# Patient Record
Sex: Male | Born: 1950 | Race: White
Health system: Southern US, Community
[De-identification: ages and names within clinical notes are randomized; demographics above are authoritative.]

## PROBLEM LIST (undated history)

## (undated) DIAGNOSIS — K449 Diaphragmatic hernia without obstruction or gangrene: Secondary | ICD-10-CM

## (undated) DIAGNOSIS — I10 Essential (primary) hypertension: Secondary | ICD-10-CM

## (undated) DIAGNOSIS — K219 Gastro-esophageal reflux disease without esophagitis: Secondary | ICD-10-CM

## (undated) DIAGNOSIS — B009 Herpesviral infection, unspecified: Secondary | ICD-10-CM

## (undated) DIAGNOSIS — M1A9XX0 Chronic gout, unspecified, without tophus (tophi): Secondary | ICD-10-CM

## (undated) DIAGNOSIS — T8859XA Other complications of anesthesia, initial encounter: Secondary | ICD-10-CM

## (undated) DIAGNOSIS — C8590 Non-Hodgkin lymphoma, unspecified, unspecified site: Secondary | ICD-10-CM

## (undated) DIAGNOSIS — E785 Hyperlipidemia, unspecified: Secondary | ICD-10-CM

## (undated) DIAGNOSIS — M545 Low back pain, unspecified: Secondary | ICD-10-CM

## (undated) DIAGNOSIS — M1990 Unspecified osteoarthritis, unspecified site: Secondary | ICD-10-CM

## (undated) DIAGNOSIS — R519 Headache, unspecified: Secondary | ICD-10-CM

## (undated) DIAGNOSIS — N429 Disorder of prostate, unspecified: Secondary | ICD-10-CM

## (undated) DIAGNOSIS — K5730 Diverticulosis of large intestine without perforation or abscess without bleeding: Secondary | ICD-10-CM

## (undated) HISTORY — DX: Non-Hodgkin lymphoma, unspecified, unspecified site: C85.90

## (undated) HISTORY — DX: Diaphragmatic hernia without obstruction or gangrene: K44.9

## (undated) HISTORY — DX: Gastro-esophageal reflux disease without esophagitis: K21.9

## (undated) HISTORY — DX: Headache, unspecified: R51.9

## (undated) HISTORY — DX: Essential (primary) hypertension: I10

## (undated) HISTORY — DX: Hyperlipidemia, unspecified: E78.5

## (undated) HISTORY — DX: Other complications of anesthesia, initial encounter: T88.59XA

## (undated) HISTORY — PX: ABLATION OF DYSRHYTHMIC FOCUS: SHX254

---
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
## 1993-07-06 DIAGNOSIS — G35D Multiple sclerosis, unspecified: Secondary | ICD-10-CM

## 1993-07-06 DIAGNOSIS — G35 Multiple sclerosis: Secondary | ICD-10-CM

## 1993-07-06 HISTORY — DX: Multiple sclerosis: G35

## 1993-07-06 HISTORY — DX: Multiple sclerosis, unspecified: G35.D

## 1995-01-10 ENCOUNTER — Emergency Department: Admit: 1995-01-10 | Payer: Self-pay | Source: Emergency Department | Admitting: Emergency Medicine

## 1995-04-29 ENCOUNTER — Ambulatory Visit: Admit: 1995-04-29 | Disposition: A | Discharge status: Home / Self Care | Payer: Self-pay | Source: Ambulatory Visit

## 1997-04-11 ENCOUNTER — Ambulatory Visit: Admission: RE | Admit: 1997-04-11 | Payer: Self-pay | Source: Ambulatory Visit

## 1998-07-17 ENCOUNTER — Ambulatory Visit
Admit: 1998-07-17 | Disposition: A | Discharge status: Home / Self Care | Payer: Self-pay | Source: Ambulatory Visit | Admitting: Neurology

## 1999-02-04 DIAGNOSIS — K589 Irritable bowel syndrome without diarrhea: Secondary | ICD-10-CM

## 1999-02-04 HISTORY — DX: Irritable bowel syndrome, unspecified: K58.9

## 2001-05-12 ENCOUNTER — Emergency Department: Admit: 2001-05-12 | Payer: Self-pay | Source: Emergency Department | Admitting: Emergency Medicine

## 2001-12-09 ENCOUNTER — Emergency Department: Admit: 2001-12-09 | Payer: Self-pay | Source: Emergency Department | Admitting: Pediatric Emergency Medicine

## 2001-12-10 ENCOUNTER — Emergency Department: Admit: 2001-12-10 | Payer: Self-pay | Source: Emergency Department | Admitting: Pediatric Emergency Medicine

## 2002-11-14 ENCOUNTER — Ambulatory Visit: Admission: RE | Admit: 2002-11-14 | Payer: Self-pay | Source: Ambulatory Visit

## 2007-07-08 ENCOUNTER — Emergency Department: Admit: 2007-07-08 | Payer: Self-pay | Source: Emergency Department | Admitting: Emergency Medicine

## 2007-08-26 ENCOUNTER — Ambulatory Visit: Admission: RE | Admit: 2007-08-26 | Payer: Self-pay | Source: Ambulatory Visit

## 2008-06-25 ENCOUNTER — Ambulatory Visit: Admit: 2008-06-25 | Disposition: A | Discharge status: Home / Self Care | Payer: Self-pay | Source: Ambulatory Visit

## 2008-12-12 ENCOUNTER — Ambulatory Visit: Admission: RE | Admit: 2008-12-12 | Payer: Self-pay | Source: Ambulatory Visit

## 2010-07-06 HISTORY — PX: EYE SURGERY: SHX253

## 2010-07-22 ENCOUNTER — Emergency Department: Admit: 2010-07-22 | Payer: Self-pay | Source: Emergency Department | Admitting: Emergency Medicine

## 2010-12-05 DIAGNOSIS — H269 Unspecified cataract: Secondary | ICD-10-CM

## 2010-12-05 HISTORY — DX: Unspecified cataract: H26.9

## 2011-01-23 ENCOUNTER — Ambulatory Visit: Admit: 2011-01-23 | Discharge: 2011-01-23 | Payer: Self-pay | Source: Ambulatory Visit | Admitting: Emergency Medicine

## 2011-02-28 ENCOUNTER — Emergency Department
Admit: 2011-02-28 | Disposition: A | Discharge status: Home / Self Care | Payer: Self-pay | Source: Emergency Department | Admitting: Emergency Medicine

## 2011-04-24 NOTE — Consults (Signed)
Please disregard this entry. This report belongs to another patient.            [name]        [id number]       12/12/08)

## 2011-04-24 NOTE — Op Note (Unsigned)
DATE OF BIRTH:                        1951/01/05      ADMISSION DATE:                     08/26/2007            PATIENT LOCATION:                     END [facility] 19            DATE OF PROCEDURE:                   08/26/2007      SURGEON:                            Paschal Dopp, MD      ASSISTANT(S):                  PREOPERATIVE DIAGNOSIS:            POSTOPERATIVE DIAGNOSIS:            PROCEDURE:  COLONOSCOPY AND POLYPECTOMY.            INDICATIONS FOR PROCEDURE:  Rectal bleeding.            SEDATION:  Propofol per anesthesia.            INSTRUMENT:  Olympus video colonoscope.            DESCRIPTION OF PROCEDURE:  After appropriate sedation was obtained, the      colonoscope was passed through the anus and into the rectum.  The      colonoscope was slowly advanced with visualization of the lumen at 75 cm on      the way in.  There was a 5-mm sessile polyp removed with 3 bites of the      jumbo biopsy forceps.  Colonoscope was then advanced to the cecum.  The      cecal mucosa appeared normal.  The colonoscope was then withdrawn with      circumferential vision of the colonic mucosa.  The ascending colon, hepatic      flexure, transverse colon, splenic flexure, and descending colon are      normal.  In the sigmoid colon there is 1 solitary diverticulum.  On the way      out the polypectomy site is now seen at 50 cm.  The rectum appears normal.      The colonoscope is retroflexed, revealing internal hemorrhoids inside the      anal verge.  The colonoscope was then withdrawn.  A digital rectal exam was      unremarkable.  The patient tolerated the procedure well.            ENDOSCOPIC IMPRESSION:      1.   Two polyps removed.      2.   Diverticulum.      3.   Internal hemorrhoids.            PLAN:  The pathology will be checked in a few days.  Followup will be based      on the pathology.  ___________________________________          Date Signed: __________      Paschal Dopp, MD   [phone])            D: 08/26/2007 by Paschal Dopp, MD      T: 08/26/2007 by [id number] ([id number]) ([id number])      cc:  Paschal Dopp, MD

## 2011-04-24 NOTE — Op Note (Unsigned)
Account Number: [id number]      Document ID: [id number]      Admit Date: 12/12/2008      Procedure Date: 12/12/2008            Patient Location: [facility]      Patient Type: A            SURGEON: Paschal Dopp MD      ASSISTANT:                  PREOPERATIVE DIAGNOSIS:            POSTOPERATIVE DIAGNOSIS:            TITLE OF PROCEDURE:      Esophagogastroduodenoscopy.            INDICATIONS:      Gastroesophageal reflux disease.            SEDATION:      Propofol per anesthesia.            INSTRUMENT:      Olympus video colonoscope.            DESCRIPTION OF PROCEDURE:      After appropriate sedation was obtained, the endoscope was passed through      the hypopharynx and into the upper esophagus.  The endoscope was slowly      advanced through the esophagus and through the stomach and into the      duodenum.  The descending duodenum and duodenal bulb appeared normal.  The      endoscope was then withdrawn into the stomach and retroflexed.  The gastric      cardia appears normal from below.  The angularis appears normal.  The      endoscope was then straightened and withdrawn.  The gastric antrum and      gastric fundus look normal.  There was a moderate-sized hiatal hernia.  The      esophagus looks normal.  The endoscope was withdrawn.  The patient      tolerated the procedure well.            ENDOSCOPIC IMPRESSION:      1.  Hiatal hernia.      2.  No evidence of esophagitis.            PLAN:      The patient will continue on his proton pump inhibitor.                              _______________________________     Date/Time Signed: _____________      Paschal Dopp MD [phone])            D:  12/12/2008 10:33 AM by Dr. Paschal Dopp, MD [phone])      T:  12/12/2008 13:45 PM by [id number]          Everlean Cherry: [id number]) (Doc ID: [id number])                  IH:[id number] Myrtis Ser MD

## 2011-05-18 ENCOUNTER — Ambulatory Visit: Payer: BLUE CROSS/BLUE SHIELD

## 2011-05-20 ENCOUNTER — Ambulatory Visit
Admission: RE | Admit: 2011-05-20 | Discharge: 2011-05-20 | Disposition: A | Discharge status: Home / Self Care | Payer: BLUE CROSS/BLUE SHIELD | Source: Ambulatory Visit | Attending: Hospitalist | Admitting: Hospitalist

## 2011-05-20 ENCOUNTER — Ambulatory Visit: Payer: Self-pay

## 2011-05-20 ENCOUNTER — Ambulatory Visit: Payer: BLUE CROSS/BLUE SHIELD

## 2011-05-20 ENCOUNTER — Encounter: Admission: RE | Disposition: A | Discharge status: Home / Self Care | Payer: Self-pay | Source: Ambulatory Visit

## 2011-05-20 ENCOUNTER — Emergency Department: Payer: BLUE CROSS/BLUE SHIELD

## 2011-05-20 ENCOUNTER — Observation Stay
Admission: EM | Admit: 2011-05-20 | Discharge: 2011-05-21 | Disposition: A | Discharge status: Home / Self Care | Payer: BLUE CROSS/BLUE SHIELD | Attending: Emergency Medicine | Admitting: Emergency Medicine

## 2011-05-20 ENCOUNTER — Ambulatory Visit: Admission: RE | Admit: 2011-05-20 | Payer: Self-pay | Source: Ambulatory Visit

## 2011-05-20 ENCOUNTER — Observation Stay: Payer: BLUE CROSS/BLUE SHIELD | Admitting: Specialist

## 2011-05-20 DIAGNOSIS — M109 Gout, unspecified: Secondary | ICD-10-CM | POA: Insufficient documentation

## 2011-05-20 DIAGNOSIS — I10 Essential (primary) hypertension: Secondary | ICD-10-CM | POA: Insufficient documentation

## 2011-05-20 DIAGNOSIS — K449 Diaphragmatic hernia without obstruction or gangrene: Secondary | ICD-10-CM | POA: Insufficient documentation

## 2011-05-20 DIAGNOSIS — E785 Hyperlipidemia, unspecified: Secondary | ICD-10-CM | POA: Insufficient documentation

## 2011-05-20 DIAGNOSIS — Z860100 Personal history of colon polyps, unspecified: Secondary | ICD-10-CM | POA: Insufficient documentation

## 2011-05-20 DIAGNOSIS — G35 Multiple sclerosis: Secondary | ICD-10-CM | POA: Insufficient documentation

## 2011-05-20 DIAGNOSIS — Z9889 Other specified postprocedural states: Secondary | ICD-10-CM | POA: Insufficient documentation

## 2011-05-20 DIAGNOSIS — K219 Gastro-esophageal reflux disease without esophagitis: Secondary | ICD-10-CM | POA: Insufficient documentation

## 2011-05-20 DIAGNOSIS — K5730 Diverticulosis of large intestine without perforation or abscess without bleeding: Secondary | ICD-10-CM | POA: Insufficient documentation

## 2011-05-20 DIAGNOSIS — D126 Benign neoplasm of colon, unspecified: Secondary | ICD-10-CM | POA: Insufficient documentation

## 2011-05-20 DIAGNOSIS — R509 Fever, unspecified: Principal | ICD-10-CM | POA: Insufficient documentation

## 2011-05-20 DIAGNOSIS — Z7982 Long term (current) use of aspirin: Secondary | ICD-10-CM | POA: Insufficient documentation

## 2011-05-20 HISTORY — DX: Diverticulosis of large intestine without perforation or abscess without bleeding: K57.30

## 2011-05-20 HISTORY — DX: Chronic gout, unspecified, without tophus (tophi): M1A.9XX0

## 2011-05-20 LAB — COMPREHENSIVE METABOLIC PANEL
ALT: 36 U/L (ref 3–36)
AST (SGOT): 30 U/L (ref 10–41)
Albumin/Globulin Ratio: 1.4 (ref 1.1–1.8)
Albumin: 4.3 g/dL (ref 3.4–4.9)
Alkaline Phosphatase: 63 U/L (ref 43–112)
BUN: 16 mg/dL (ref 8–20)
Bilirubin, Total: 0.4 mg/dL (ref 0.1–1.0)
CO2: 22 mEq/L (ref 21–30)
Calcium: 8.6 mg/dL (ref 8.6–10.2)
Chloride: 100 mEq/L (ref 98–107)
Creatinine: 0.9 mg/dL (ref 0.6–1.5)
Globulin: 3.1 g/dL (ref 2.0–3.7)
Glucose: 116 mg/dL — ABNORMAL HIGH (ref 70–100)
Potassium: 3.6 mEq/L (ref 3.6–5.0)
Protein, Total: 7.4 g/dL (ref 6.0–8.0)
Sodium: 138 mEq/L (ref 136–146)

## 2011-05-20 LAB — URINALYSIS, REFLEX TO MICROSCOPIC EXAM IF INDICATED
Bilirubin, UA: NEGATIVE
Blood, UA: NEGATIVE
Glucose, UA: NEGATIVE
Ketones UA: NEGATIVE
Leukocyte Esterase, UA: NEGATIVE
Nitrite, UA: NEGATIVE
Specific Gravity UA POCT: 1.016 (ref 1.001–1.035)
Urine pH: 6 (ref 5.0–8.0)
Urobilinogen, UA: NORMAL mg/dL

## 2011-05-20 LAB — CBC AND DIFFERENTIAL
Basophils Absolute Automated: 0.01 10*3/uL (ref 0.00–0.20)
Basophils Automated: 0 % (ref 0–2)
Eosinophils Absolute Automated: 0.01 10*3/uL (ref 0.00–0.70)
Eosinophils Automated: 0 % (ref 0–5)
Hematocrit: 38.6 % — ABNORMAL LOW (ref 42.0–52.0)
Hgb: 13.2 g/dL (ref 13.0–17.0)
Immature Granulocytes Absolute: 0.02 10*3/uL
Immature Granulocytes: 0 % (ref 0–1)
Lymphocytes Absolute Automated: 0.66 10*3/uL (ref 0.50–4.40)
Lymphocytes Automated: 5 % — ABNORMAL LOW (ref 15–41)
MCH: 28 pg (ref 28.0–32.0)
MCHC: 34.2 g/dL (ref 32.0–36.0)
MCV: 81.8 fL (ref 80.0–100.0)
MPV: 9.5 fL (ref 9.4–12.3)
Monocytes Absolute Automated: 0.46 10*3/uL (ref 0.00–1.20)
Monocytes: 4 % (ref 0–11)
Neutrophils Absolute: 11.73 10*3/uL — ABNORMAL HIGH (ref 1.80–8.10)
Neutrophils: 91 % — ABNORMAL HIGH (ref 52–75)
Nucleated RBC: 0 /100 WBC
Platelets: 204 10*3/uL (ref 140–400)
RBC: 4.72 10*6/uL (ref 4.70–6.00)
RDW: 13 % (ref 12–15)
WBC: 12.89 10*3/uL — ABNORMAL HIGH (ref 3.50–10.80)

## 2011-05-20 LAB — I-STAT CG4 VENOUS CARTRIDGE
Lactic Acid I-Stat: 1.8 mEq/L (ref 0.5–2.2)
i-STAT Base Excess Venous: -1 mEq/L
i-STAT FIO2: 21
i-STAT HCO3 Bicarbonate Venous: 22.2 mEq/L
i-STAT O2 Saturation Venous: 70 %
i-STAT Patient Temperature: 100.9
i-STAT Total CO2 Venous: 23 mEq/L
i-STAT pCO2 Venous: 35 mmHg
i-STAT pH Venous: 7.415
i-STAT pO2 Venous: 38 mmHg

## 2011-05-20 LAB — GFR: EGFR: >60

## 2011-05-20 SURGERY — DONT USE, USE 1094-COLONOSCOPY, DIAGNOSTIC (SCREENING)
Anesthesia: Anesthesia General | Site: Anus

## 2011-05-20 MED ORDER — PROPOFOL 10 MG/ML IV EMUL
INTRAVENOUS | Status: DC | PRN
Start: 2011-05-20 — End: 2011-05-20
  Administered 2011-05-20: 200 ug/kg/min via INTRAVENOUS

## 2011-05-20 MED ORDER — LEVOFLOXACIN IN D5W 500 MG/100ML IV SOLN
500.00 mg | Freq: Every day | INTRAVENOUS | Status: DC
Start: 2011-05-21 — End: 2011-05-21

## 2011-05-20 MED ORDER — GLYCOPYRROLATE 0.2 MG/ML IJ SOLN
INTRAMUSCULAR | Status: DC | PRN
Start: 2011-05-20 — End: 2011-05-20
  Administered 2011-05-20: 0.2 mg via INTRAVENOUS

## 2011-05-20 MED ORDER — HEPARIN SODIUM (PORCINE) 5000 UNIT/ML IJ SOLN
5000.00 [IU] | Freq: Two times a day (BID) | INTRAMUSCULAR | Status: DC
Start: 2011-05-20 — End: 2011-05-21
  Administered 2011-05-21 (×2): 5000 [IU] via SUBCUTANEOUS
  Filled 2011-05-20 (×2): qty 1

## 2011-05-20 MED ORDER — PHENYLEPHRINE 100 MCG/ML IV BOLUS (ANESTHESIA)
PREFILLED_SYRINGE | INTRAVENOUS | Status: DC | PRN
Start: 2011-05-20 — End: 2011-05-20
  Administered 2011-05-20 (×3): 100 ug via INTRAVENOUS

## 2011-05-20 MED ORDER — SODIUM CHLORIDE 0.9 % IV BOLUS
1000.00 mL | Freq: Once | INTRAVENOUS | Status: AC
Start: 2011-05-20 — End: 2011-05-20
  Administered 2011-05-20: 1000 mL via INTRAVENOUS

## 2011-05-20 MED ORDER — METRONIDAZOLE IN NACL 500 MG/100 ML IV SOLN
500.00 mg | Freq: Once | INTRAVENOUS | Status: AC
Start: 2011-05-20 — End: 2011-05-20
  Administered 2011-05-20: 500 mg via INTRAVENOUS
  Filled 2011-05-20: qty 100

## 2011-05-20 MED ORDER — ACETAMINOPHEN 500 MG PO TABS
ORAL_TABLET | ORAL | Status: DC
Start: 2011-05-20 — End: 2011-05-20
  Filled 2011-05-20: qty 2

## 2011-05-20 MED ORDER — LACTATED RINGERS IV SOLN
INTRAVENOUS | Status: DC
Start: 2011-05-20 — End: 2011-05-20

## 2011-05-20 MED ORDER — LEVOFLOXACIN IN D5W 750 MG/150ML IV SOLN
750.00 mg | Freq: Once | INTRAVENOUS | Status: AC
Start: 2011-05-20 — End: 2011-05-20
  Administered 2011-05-20: 750 mg via INTRAVENOUS
  Filled 2011-05-20: qty 150

## 2011-05-20 MED ORDER — DOXAZOSIN MESYLATE 2 MG PO TABS
2.00 mg | ORAL_TABLET | Freq: Every evening | ORAL | Status: DC
Start: 2011-05-20 — End: 2011-05-21
  Administered 2011-05-21: 2 mg via ORAL
  Filled 2011-05-20 (×5): qty 1

## 2011-05-20 MED ORDER — LISINOPRIL 10 MG PO TABS
10.00 mg | ORAL_TABLET | Freq: Every day | ORAL | Status: DC
Start: 2011-05-21 — End: 2011-05-21
  Administered 2011-05-21: 10 mg via ORAL
  Filled 2011-05-20: qty 1

## 2011-05-20 MED ORDER — PANTOPRAZOLE SODIUM 40 MG PO TBEC
40.00 mg | DELAYED_RELEASE_TABLET | Freq: Every day | ORAL | Status: DC
Start: 2011-05-21 — End: 2011-05-21
  Filled 2011-05-20: qty 1

## 2011-05-20 MED ORDER — SODIUM CHLORIDE 0.9 % IV SOLN
INTRAVENOUS | Status: DC | PRN
Start: 2011-05-20 — End: 2011-05-20

## 2011-05-20 MED ORDER — ACETAMINOPHEN 500 MG PO TABS
1000.00 mg | ORAL_TABLET | Freq: Once | ORAL | Status: AC
Start: 2011-05-20 — End: 2011-05-20
  Administered 2011-05-20: 1000 mg via ORAL

## 2011-05-20 MED ORDER — PROPOFOL 10 MG/ML IV EMUL
INTRAVENOUS | Status: DC | PRN
Start: 2011-05-20 — End: 2011-05-20
  Administered 2011-05-20: 100 mg via INTRAVENOUS
  Administered 2011-05-20: 50 mg via INTRAVENOUS

## 2011-05-20 MED ORDER — METRONIDAZOLE IN NACL 500 MG/100 ML IV SOLN
500.00 mg | Freq: Three times a day (TID) | INTRAVENOUS | Status: DC
Start: 2011-05-21 — End: 2011-05-21
  Administered 2011-05-21: 500 mg via INTRAVENOUS
  Filled 2011-05-20 (×2): qty 100

## 2011-05-20 MED ORDER — ACETAMINOPHEN 325 MG PO TABS
650.0000 mg | ORAL_TABLET | Freq: Four times a day (QID) | ORAL | Status: DC | PRN
Start: 2011-05-20 — End: 2011-05-21
  Administered 2011-05-21: 650 mg via ORAL
  Filled 2011-05-20: qty 2

## 2011-05-20 SURGICAL SUPPLY — 12 items
APPLICATOR PROCTOSCOPIC 16IN (Applicator) ×1 IMPLANT
CONTAINER SPEC LLDPE 16OZ W LF NS LEK (Procedure Accessories) ×2 IMPLANT
CONTAINER SPECIMEN C16 OZ WIDE LEAK SHATTER RESISTANT SNAP ON LID (Procedure Accessories) ×2 IMPLANT
FORCEP BIOPSY 240CM RADIAL JA (Instrument) ×1 IMPLANT
FORCEP BIOPSY 240CM RADIALJAW (Instrument) ×1 IMPLANT
GLOVE NITRILE PREMIERPRO MED (Glove) ×3 IMPLANT
JELLY KY LUBRICATNG 2 OZ FLIP (Procedure Accessories) ×1 IMPLANT
PAD ELECTROSRG GRND REM W CRD (Procedure Accessories) ×1 IMPLANT
SYRINGE 50 ML GRADUATE NONPYROGENIC DEHP (Syringes, Needles) ×1
SYRINGE 50 ML GRADUATE NONPYROGENIC DEHP FREE PVC FREE BD MEDICAL (Syringes, Needles) ×1 IMPLANT
TUBING SUCTION ID3/16 IN L10 FT (Tubing) ×1 IMPLANT
TUBING SUCTION ID3/16 IN L10 FT NONCONDUCTIVE STRAIGHT MALE FEMALE (Tubing) ×1 IMPLANT

## 2011-05-20 NOTE — ED Notes (Signed)
Lactate: 1.82

## 2011-05-20 NOTE — ED Provider Notes (Signed)
History     Chief Complaint   Patient presents with   . Fever     HPI Comments: 60 y.o. M, BIBA, h/o HTN, high chol, GERD, hiatal hernia, and multiple sclerosis p/w fevers TMAX 103.8 (in amb) onset this afternoon. Pt reports he had a routine colonoscopy this AM, got home and ate some toast then began to shake uncontrollably, took temp and measured 102.0. Pt reports he has had 4 colonoscopies before and has never had these sxs after before. Pt sts he feels improved now in ED. Denies n/v/d, abd pain, SOB or HA. No other complaints.     PMD + GI doc: Paschal Dopp    Patient is a 60 y.o. male presenting with fever. The history is provided by the patient and the spouse. No language interpreter was used.   Fever  Primary symptoms of the febrile illness include fever. Primary symptoms do not include headaches, shortness of breath, abdominal pain, nausea, vomiting or diarrhea. The current episode started today. This is a new problem. The problem has been gradually improving.   Associated with: unknown.       Past Medical History   Diagnosis Date   . Hypertension    . Hyperlipidemia    . GERD (gastroesophageal reflux disease)    . Hiatal hernia    . Multiple sclerosis        Past Surgical History   Procedure Date   . Tonsillectomy      at age 19   . Eye surgery      bil cataracts removed       No family history on file.    No current facility-administered medications for this encounter.     Current Outpatient Prescriptions   Medication Sig Dispense Refill   . acyclovir (ZOVIRAX) 200 MG capsule Take 200 mg by mouth daily.         Marland Kitchen aspirin 81 MG tablet Take 81 mg by mouth daily.         Marland Kitchen atropine-PHENobarbital-scopolamine-hyoscyamine (DONNATAL) 16.2 MG per tablet Take 1 tablet by mouth 2 (two) times daily.         Marland Kitchen doxazosin (CARDURA) 2 MG tablet Take 2 mg by mouth nightly.         . ergocalciferol (ERGOCALCIFEROL) 50000 UNITS capsule Take 5,000 Units by mouth daily.         . Febuxostat (ULORIC) 40 MG tablet Take 40 mg by  mouth daily.         Marland Kitchen gemfibrozil (LOPID) 600 MG tablet Take 600 mg by mouth 2 (two) times daily before meals.         Marland Kitchen glucosamine-chondroitin 500-400 MG tablet Take 3 tablets by mouth 2 (two) times daily.         . lansoprazole (PREVACID) 15 MG capsule Take 15 mg by mouth daily.         Marland Kitchen lisinopril (PRINIVIL,ZESTRIL) 10 MG tablet Take 10 mg by mouth daily.         . ranitidine (ZANTAC) 300 MG capsule Take 300 mg by mouth every evening.           Facility-Administered Medications Ordered in Other Encounters   Medication Dose Route Frequency Provider Last Rate Last Dose   . DISCONTD: 0.9%  NaCl infusion    Continuous PRN Glenna Fellows, MD       . DISCONTD: glycopyrrolate (ROBINUL) injection    PRN Glenna Fellows, MD   0.2 mg at 05/20/11 0951   .  DISCONTD: lactated ringers infusion   Intravenous Continuous Paschal Dopp, MD       . DISCONTD: phenylEPHrine (NEOSYNEPHRINE) 100 mcg/mL IV syringe    PRN Glenna Fellows, MD   100 mcg at 05/20/11 1000   . DISCONTD: propofol (DIPRIVAN) injection    PRN Glenna Fellows, MD   50 mg at 05/20/11 0945   . DISCONTD: propofol (DIPRIVAN) injection    Continuous PRN Glenna Fellows, MD   75 mcg/kg/min at 05/20/11 0950       Allergies   Allergen Reactions   . Erythromycin Fever       History   Substance Use Topics   . Smoking status: Never Smoker    . Smokeless tobacco: Not on file   . Alcohol Use: No       Review of Systems   Constitutional: Positive for fever and chills (chills improved).   Respiratory: Negative for shortness of breath.    Cardiovascular: Negative for chest pain.   Gastrointestinal: Negative for nausea, vomiting, abdominal pain and diarrhea.   Neurological: Negative for headaches.   All other systems reviewed and are negative.        Physical Exam   BP 149/73  Pulse 117  Temp(Src) 100.9 F (38.3 C) (Oral)  Resp 20  Ht 1.753 m  Wt 83.008 kg  BMI 27.01 kg/m2  SpO2 98%    Physical Exam   Constitutional: He is oriented to person, place, and time. He appears well-developed and  well-nourished.   Neck: Neck supple.   Cardiovascular: Regular rhythm and normal heart sounds.         tachy   Pulmonary/Chest: Effort normal and breath sounds normal. No respiratory distress.   Abdominal: Soft. Bowel sounds are normal. He exhibits no distension. There is no tenderness.        Soft, no abd ttp   Musculoskeletal: Normal range of motion. He exhibits no edema and no tenderness.   Neurological: He is alert and oriented to person, place, and time.   Skin: Skin is warm and dry.   Psychiatric: He has a normal mood and affect.       ED Course   Procedures    MDM      .I am scribing for Latanya Presser, MD on [facility] K. Ahronovich (ED Scribe)    3:55 PM  05/20/2011     6:56 PM d/w Dr. York Grice, on call for Dr. Georga Bora. Agrees to admit to med/tele, obs status     EKG interpretation: Charted by [facility] K. Ahronovich as interpreted by Latanya Presser, MD:  Interpretation: Normal sinus at 100. No acute ST/T wave changes.     Latanya Presser, MD  05/20/11 2122

## 2011-05-20 NOTE — Brief Op Note (Signed)
BRIEF OP NOTE    Date Time: 05/20/2011 10:14 AM    Patient Name:   Emori Kamau    Date of Operation:   05/20/2011    Providers Performing:   Surgeon(s):  Paschal Dopp, MD    Assistant (s):    Operative Procedure:   Procedure(s):  COLONOSCOPY    Preoperative Diagnosis:   Pre-Op Diagnosis Codes:     * Benign neoplasm of colon [211.3]    Postoperative Diagnosis:   * No post-op diagnosis entered *    Anesthesia:   IV Sedation    Estimated Blood Loss:        Implants:   * No implants in log *          Specimens:        SPECIMENS (last 24 hours)      Non-Carefusion Specimens     Row Name 05/20/11 1000                Specimen Information    Specimen Testing Required Routine Pathology     Specimen ID (letter) a     Specimen Description polyps @ 20cm     Specimen Information    Specimen Testing Required Routine Pathology     Specimen ID (letter) b     Specimen Description polyp @ 15cm           Findings:   2 polypss at 20 cm,  One polyp  At 15 cm sigmoid diverticulosis  Internal hemorrhoids    Complications:   None      Signed by: Paschal Dopp, MD                                                                              [facility] END[facility]

## 2011-05-20 NOTE — ED Notes (Signed)
Colonoscopy this am ,  4 hours after procedure pt started shaking and had a fever ,  Now lasting last 4 hours  Temp 103.8 tympanic 162/70 hr 124 rr28  lw 20g ST on monitor ems hung .9 nss kvo hung

## 2011-05-20 NOTE — H&P (Signed)
ADMISSION HISTORY AND PHYSICAL EXAM    Date Time: 05/20/2011 9:22 AM  Patient Name: [name] Young  Attending Physician: Paschal Dopp, MD    Assessment:   Patient has had previous polyps    Plan:   colonoscopy    History of Present Illness:   Kayne Yuhas is a 60 y.o. male who presents to the hospital with previous polyps    Past Medical History:     Past Medical History   Diagnosis Date   . Hypertension    . Hyperlipidemia    . GERD (gastroesophageal reflux disease)    . Hiatal hernia    . Multiple sclerosis        Past Surgical History:     Past Surgical History   Procedure Date   . Tonsillectomy      at age 67   . Eye surgery      bil cataracts removed       Family History:   History reviewed. No pertinent family history.    Social History:     History     Social History   . Marital Status: Married     Spouse Name: N/A     Number of Children: N/A   . Years of Education: N/A     Social History Main Topics   . Smoking status: Never Smoker    . Smokeless tobacco: Not on file   . Alcohol Use: No   . Drug Use: No   . Sexually Active:      Other Topics Concern   . Not on file     Social History Narrative   . No narrative on file       Allergies:     Allergies   Allergen Reactions   . Erythromycin Fever       Medications:     Prescriptions prior to admission   Medication Sig   . acyclovir (ZOVIRAX) 200 MG capsule Take 200 mg by mouth daily.     Marland Kitchen aspirin 81 MG tablet Take 81 mg by mouth daily.     Marland Kitchen atropine-PHENobarbital-scopolamine-hyoscyamine (DONNATAL) 16.2 MG per tablet Take 1 tablet by mouth 2 (two) times daily.     Marland Kitchen doxazosin (CARDURA) 2 MG tablet Take 2 mg by mouth nightly.     . ergocalciferol (ERGOCALCIFEROL) 50000 UNITS capsule Take 5,000 Units by mouth daily.     . Febuxostat (ULORIC) 40 MG tablet Take 40 mg by mouth daily.     Marland Kitchen gemfibrozil (LOPID) 600 MG tablet Take 600 mg by mouth 2 (two) times daily before meals.     Marland Kitchen glucosamine-chondroitin 500-400 MG tablet Take 3 tablets by mouth 2 (two) times  daily.     . lansoprazole (PREVACID) 15 MG capsule Take 15 mg by mouth daily.     Marland Kitchen lisinopril (PRINIVIL,ZESTRIL) 10 MG tablet Take 10 mg by mouth daily.     . ranitidine (ZANTAC) 300 MG capsule Take 300 mg by mouth every evening.         Review of Systems:   A comprehensive review of systems was:     Physical Exam:     Filed Vitals:    05/20/11 0848   BP: 169/81   Pulse: 80   Temp: 95.9 F (35.5 C)       Intake and Output Summary (Last 24 hours) at Date Time  No intake or output data in the 24 hours ending 05/20/11 0922    Chest - clear to auscultation,  no wheezes, rales or rhonchi, symmetric air entry    Heart RR  abd benign    Labs:     Results     ** No Results found for the last 24 hours. **          Recent CMP No results found for this basename: GLU,BUN,CREAT,NA,CK, CL,CO2,CA,TP,ALB,AST,ALT,ALP,BILIT,GLOB,AG, AGAP in the last 24 hours    Rads:   Radiological Procedure reviewed.    Signed by: Paschal Dopp

## 2011-05-20 NOTE — Transfer of Care (Signed)
Anesthesia Transfer of Care Note    Patient: [name] Young    Procedures performed: Procedure(s) with comments:  COLONOSCOPY - GELAB/OP/IVA    Anesthesia type: General TIVA    Patient location:Phase II PACU    Last vitals:   Filed Vitals:    05/20/11 0848   BP: 169/81   Pulse: 80   Temp: 95.9 F (35.5 C)       Post pain: Patient not complaining of pain, continue current therapy     Mental Status:awake    Respiratory Function: tolerating face mask    Cardiovascular: stable    Nausea/Vomiting: patient not complaining of nausea or vomiting    Hydration Status: adequate    Regional Anesthesia: no apparent complications    Post assessment: no apparent anesthetic complications and no reportable events      BP 97/64, HR 84 SR,  RR 25, O2 sats 99.    Report to RN.  VSS

## 2011-05-20 NOTE — Anesthesia Preprocedure Evaluation (Addendum)
Anesthesia Evaluation    AIRWAY    Mallampati: III    TM distance: >3 FB  Neck ROM: full  Mouth Opening:full   CARDIOVASCULAR    cardiovascular exam normal     DENTAL     PULMONARY    pulmonary exam normal     OTHER FINDINGS          Anesthesia Plan    ASA 3   general   Detailed anesthesia plan: general IV        intravenous induction   informed consent obtained    Plan discussed with CRNA.        <IHSANLANDD>

## 2011-05-21 LAB — CBC AND DIFFERENTIAL
Basophils Absolute Automated: 0.03 10*3/uL (ref 0.00–0.20)
Basophils Automated: 0 % (ref 0–2)
Eosinophils Absolute Automated: 0.07 10*3/uL (ref 0.00–0.70)
Eosinophils Automated: 0 % (ref 0–5)
Hematocrit: 35.8 % — ABNORMAL LOW (ref 42.0–52.0)
Hgb: 12.2 g/dL — ABNORMAL LOW (ref 13.0–17.0)
Immature Granulocytes Absolute: 0.05 10*3/uL
Immature Granulocytes: 0 % (ref 0–1)
Lymphocytes Absolute Automated: 2.77 10*3/uL (ref 0.50–4.40)
Lymphocytes Automated: 18 % (ref 15–41)
MCH: 27.6 pg — ABNORMAL LOW (ref 28.0–32.0)
MCHC: 34.1 g/dL (ref 32.0–36.0)
MCV: 81 fL (ref 80.0–100.0)
MPV: 9.3 fL — ABNORMAL LOW (ref 9.4–12.3)
Monocytes Absolute Automated: 0.91 10*3/uL (ref 0.00–1.20)
Monocytes: 6 % (ref 0–11)
Neutrophils Absolute: 11.78 10*3/uL — ABNORMAL HIGH (ref 1.80–8.10)
Neutrophils: 76 % — ABNORMAL HIGH (ref 52–75)
Nucleated RBC: 0 /100 WBC
Platelets: 209 10*3/uL (ref 140–400)
RBC: 4.42 10*6/uL — ABNORMAL LOW (ref 4.70–6.00)
RDW: 13 % (ref 12–15)
WBC: 15.61 10*3/uL — ABNORMAL HIGH (ref 3.50–10.80)

## 2011-05-21 LAB — COMPREHENSIVE METABOLIC PANEL
ALT: 30 U/L (ref 3–36)
AST (SGOT): 22 U/L (ref 10–41)
Albumin/Globulin Ratio: 1.1 (ref 1.1–1.8)
Albumin: 3.4 g/dL (ref 3.4–4.9)
Alkaline Phosphatase: 56 U/L (ref 43–112)
BUN: 15 mg/dL (ref 8–20)
Bilirubin, Total: 0.7 mg/dL (ref 0.1–1.0)
CO2: 22 mEq/L (ref 21–30)
Calcium: 8.7 mg/dL (ref 8.6–10.2)
Chloride: 104 mEq/L (ref 98–107)
Creatinine: 0.9 mg/dL (ref 0.6–1.5)
Globulin: 3 g/dL (ref 2.0–3.7)
Glucose: 97 mg/dL (ref 70–100)
Potassium: 3.9 mEq/L (ref 3.6–5.0)
Protein, Total: 6.4 g/dL (ref 6.0–8.0)
Sodium: 138 mEq/L (ref 136–146)

## 2011-05-21 LAB — ECG 12-LEAD
Atrial Rate: 101 {beats}/min
P Axis: 25 degrees
P-R Interval: 194 ms
Q-T Interval: 296 ms
QRS Duration: 80 ms
QTC Calculation (Bezet): 383 ms
R Axis: -1 degrees
T Axis: 4 degrees
Ventricular Rate: 101 {beats}/min

## 2011-05-21 LAB — GFR: EGFR: >60

## 2011-05-21 MED ORDER — LEVOFLOXACIN 500 MG PO TABS
750.00 mg | ORAL_TABLET | Freq: Every day | ORAL | Status: AC
Start: 2011-05-21 — End: 2011-05-31

## 2011-05-21 NOTE — H&P (Signed)
HISTORY OF PRESENT ILLNESS:  The patient is a 60 year old white male who is a patient of Dr. Paschal Dopp, who was admitted after he experienced rigors and chills post a  colonoscopy procedure overnight on observation.     PAST MEDICAL HISTORY:  Significant for benign prostatic hyperplasia, history of hyperlipidemia,  hypertension.     SOCIAL HISTORY:  Negative for any alcohol, tobacco, or IV drug abuse.     FAMILY HISTORY:  Noncontributory.     MEDICATIONS:  His outpatient list of medications are acyclovir, aspirin, Cardura,,  glucosamine, Prevacid, lisinopril, multivitamin, and Zantac.     REVIEW OF SYSTEMS:  Currently negative for any fever, chills, nausea, vomiting, diarrhea,  headache, lightheadedness, or dizziness.     ALLERGIES:  ERYTHROMYCIN and ALLOPURINOL.     PHYSICAL EXAMINATION:  VITAL SIGNS:  His T-max yesterday was 103.8, currently is 96.6, heart rate  of 76, respirations of 20, blood pressure 132/69, saturating 98% on room  air.  GENERAL:  At the time of my exam, he is alert and oriented x3, no apparent  respiratory distress.  HEENT:  Normocephalic, atraumatic.  His extraocular movements are intact.   Pupils equal, round, reactive to light and accommodation.  NECK:  No JVD, no lymphadenopathy, no thyromegaly.  HEART:  S1, S2 are audible.  CHEST:  Clear to auscultation bilaterally.  ABDOMEN:  Soft, nontender, normoactive bowel sounds, no hepatosplenomegaly.  EXTREMITIES:  Show no cyanosis, clubbing or edema.  SKIN:  Warm and dry.  MUSCULOSKELETAL:  No obvious deformity.  NEUROLOGIC:  Nonfocal.     LABORATORY AND DIAGNOSTIC DATA:  White count is 15.61, hematocrit of 35.8, platelets of 209.  Sodium is 138,  potassium 3.9, chloride 104, bicarbonate 22, BUN 15, creatinine 0.9, blood  sugar of 97.  His urine is pH of 6, specific gravity of 1.016 with a trace  protein, negative for nitrites.  Blood cultures obtained in the ER does not  show any acute infection.  Chest x-ray does not show any infection.      ASSESSMENT AND PLAN:  The patient admitted overnight with diagnosis of possible infection.  We  started him on Levaquin and Flagyl IV.  Plan is to observe him for 24  hours, and if clinically stable, the patient is going to be discharged and  follow with Dr. Paschal Dopp in his office with blood cultures pending as an  outpatient.

## 2011-05-21 NOTE — Progress Notes (Signed)
Pt. And Pt.'s wife read and given discharge instructions, and prescriptions, both stated understanding. Tech is removing IV's and telemetry, awaiting escort out.

## 2011-05-21 NOTE — Consults (Signed)
[name] Young  60 y.o. Male     Nutrition Consult re: diet  Current Medical condition: pt admit to floor for observaton    Past Medical History   Diagnosis Date   . Hypertension    . Hyperlipidemia    . GERD (gastroesophageal reflux disease)    . Hiatal hernia    . Multiple sclerosis    . Chronic gout    . Diverticulosis of colon without diverticulitis      Labs:  Reviewed CMP WNL  Meds: vitamin D, PPI     Anthropometrics  Height: 175.3 cm (5\' 9" )  Weight: 83.008 kg (183 lb)  Weight Change: 0   BMI (Calculated): 27.1     Diet: Regular     Nutrition Diagnosis: no acute nutrition issues at this time.       Intervention:  No further follow up; unless re consulted.       Cloyde Reams, MS, RD, CSR   Ext [phone] or Pager [phone]

## 2011-05-21 NOTE — Progress Notes (Signed)
Patient arrived to the floor for observation. vss no complaint of pain at this time

## 2011-05-21 NOTE — Progress Notes (Signed)
Talmage Teaster is a 60 y.o. male patient.  Active Problems:   * No active hospital problems. *     Past Medical History   Diagnosis Date   . Hypertension    . Hyperlipidemia    . GERD (gastroesophageal reflux disease)    . Hiatal hernia    . Multiple sclerosis    . Chronic gout    . Diverticulosis of colon without diverticulitis      Current Facility-Administered Medications   Medication Dose Route Frequency Provider Last Rate Last Dose   . acetaminophen (TYLENOL) tablet 1,000 mg  1,000 mg Oral Once Latanya Presser, MD   1,000 mg at 05/20/11 2028   . acetaminophen (TYLENOL) tablet 650 mg  650 mg Oral Q6H PRN Schuyler Amor, MD   650 mg at 05/21/11 0054   . doxazosin (CARDURA) tablet 2 mg  2 mg Oral QHS Schuyler Amor, MD   2 mg at 05/21/11 0234   . heparin (porcine) injection 5,000 Units  5,000 Units Subcutaneous BID Schuyler Amor, MD   5,000 Units at 05/21/11 [phone]   . levofloxacin (LEVAQUIN) IVPB 500 mg  500 mg Intravenous Daily Schuyler Amor, MD       . levofloxacin [facility]) IVPB 750 mg  750 mg Intravenous Once Latanya Presser, MD   750 mg at 05/20/11 2120   . lisinopril (PRINIVIL,ZESTRIL) tablet 10 mg  10 mg Oral Daily Schuyler Amor, MD   10 mg at 05/21/11 0959   . metroNIDAZOLE (FLAGYL) IVPB 500 mg  500 mg Intravenous Once Latanya Presser, MD   500 mg at 05/20/11 2018   . metroNIDAZOLE (FLAGYL) IVPB 500 mg  500 mg Intravenous [facility] Schuyler Amor, MD   500 mg at 05/21/11 0430   . pantoprazole (PROTONIX) EC tablet 40 mg  40 mg Oral Daily Schuyler Amor, MD       . sodium chloride 0.9 % bolus 1,000 mL  1,000 mL Intravenous Once Latanya Presser, MD   1,000 mL at 05/20/11 1638   . sodium chloride 0.9 % bolus 1,000 mL  1,000 mL Intravenous Once Latanya Presser, MD   1,000 mL at 05/20/11 1821   . DISCONTD: acetaminophen (TYLENOL) 500 MG tablet              Allergies   Allergen Reactions   . Erythromycin Fever   . Allopurinol Hives     Blood pressure 132/69, pulse 76, temperature 96.6 F (35.9 C), temperature source Oral,  resp. rate 20, height 1.753 m (5\' 9" ), weight 83.008 kg (183 lb), SpO2 98.00%.    Subjective:  Symptoms:  Resolved.  No shortness of breath, malaise, cough, chest pain, weakness, headache, chest pressure, anorexia, diarrhea or anxiety.    Diet:  Adequate intake.  No nausea or vomiting.    Activity level: Normal.    Pain:  He reports no pain.      Objective:  General Appearance:  Comfortable.    Vital signs: (most recent): Blood pressure 132/69, pulse 76, temperature 96.6 F (35.9 C), temperature source Oral, resp. rate 20, height 1.753 m (5\' 9" ), weight 83.008 kg (183 lb), SpO2 98.00%.  Vital signs are normal.    Output: Producing urine and producing stool.    HEENT: Normal HEENT exam.    Lungs:  Normal respiratory rate and normal effort.  He is not in respiratory distress.  Breath sounds clear to auscultation.  No stridor.  No  wheezes, rales, rhonchi or decreased breath sounds.    Heart: Normal rate.  Regular rhythm.  S1 normal and S2 normal.    Chest: No chest wall tenderness.  Symmetric chest wall expansion.   Extremities: Normal range of motion.    Neurological: Patient is oriented to person, place and time.    Skin:  Warm and dry.    Abdomen: Abdomen is soft.    Bowel sounds:  Bowel sounds are normal.  There is no abdominal tenderness.   There is no mass. There is no splenomegaly. There is no hepatomegaly.   Pupils:  Pupils are equal, round, and reactive to light.    Pulses: Distal pulses are intact.      Results     Procedure Component Value Units Date/Time    Comprehensive metabolic panel [[id number]] Collected:05/21/11 0511    Specimen Information:Blood Updated:05/21/11 0712     Glucose 97 mg/dL      BUN 15 mg/dL      Creatinine 0.9 mg/dL      Sodium 884 mEq/L      Potassium 3.9 mEq/L      Chloride 104 mEq/L      CO2 22 mEq/L      Calcium 8.7 mg/dL      Total Protein 6.4 g/dL      Albumin 3.4 g/dL      AST (SGOT) 22 U/L      ALT 30 U/L      Alkaline Phosphatase 56 U/L      Bilirubin, Total 0.7 mg/dL       Globulin 3.0 g/dL      Albumin/Globulin Ratio 1.1     GFR [[id number]] Collected:05/21/11 0511     GFR Caucasian >60   Updated:05/21/11 0712    CBC and differential [[id number]]  (Abnormal) Collected:05/21/11 0511    Specimen Information:Blood Updated:05/21/11 0605     White Blood Cells 15.61 (H) x10 3/uL      RBC 4.42 (L) x10 6/uL      Hgb 12.2 (L) g/dL      Hematocrit 73.2 (L) %      MCV 81.0 fL      MCH 27.6 (L) pg      MCHC 34.1 g/dL      RDW 13 %      Platelets 209 x10 3/uL      MPV 9.3 (L) fL      Neutrophils 76 (H) %      Lymphocytes Automated 18 %      Monocytes Automated 6 %      Eosinophils Automated 0 %      Basophils Automated 0 %      Immature Granulocyte 0 %      Nucleated RBC . 0 /100 WBC      Neutrophils Absolute 11.78 (H) x10 3/uL      Abs Lymph Automated 2.77 x10 3/uL      Abs Mono Automated 0.91 x10 3/uL      Abs Eos Automated 0.07 x10 3/uL      Absolute Baso Automated 0.03 x10 3/uL      Absolute Immature Granulocyte 0.05 x10 3/uL     Urine Culture [[id number]] Collected:05/20/11 1632    Specimen Information:Urine / Urine, Clean Catch Updated:05/20/11 1806    Blood Culture #1 [[id number]] Collected:05/20/11 1628    Specimen Information:Blood / Blood Updated:05/20/11 1756    Narrative:    Site?->arm    Blood Culture #2 [[id number]] Collected:05/20/11 1628    Specimen Information:Blood /  Blood Updated:05/20/11 1756    Narrative:    Site?->arm    Comprehensive Metabolic Panel (CMP) [[id number]]  (Abnormal) Collected:05/20/11 1627    Specimen Information:Blood Updated:05/20/11 1707     Glucose 116 (H) mg/dL      BUN 16 mg/dL      Creatinine 0.9 mg/dL      Sodium 413 mEq/L      Potassium 3.6 mEq/L      Chloride 100 mEq/L      CO2 22 mEq/L      Calcium 8.6 mg/dL      Total Protein 7.4 g/dL      Albumin 4.3 g/dL      AST (SGOT) 30 U/L      ALT 36 U/L      Alkaline Phosphatase 63 U/L      Bilirubin, Total 0.4 mg/dL      Globulin 3.1 g/dL      Albumin/Globulin Ratio 1.4     GFR [[id number]] Collected:05/20/11 1627      GFR Caucasian >60   Updated:05/20/11 1707    CBC and Differential [[id number]]  (Abnormal) Collected:05/20/11 1627    Specimen Information:Blood Updated:05/20/11 1657     White Blood Cells 12.89 (H) x10 3/uL      RBC 4.72 x10 6/uL      Hgb 13.2 g/dL      Hematocrit 34.7 (L) %      MCV 81.8 fL      MCH 28.0 pg      MCHC 34.2 g/dL      RDW 13 %      Platelets 204 x10 3/uL      MPV 9.5 fL      Neutrophils 91 (H) %      Lymphocytes Automated 5 (L) %      Monocytes Automated 4 %      Eosinophils Automated 0 %      Basophils Automated 0 %      Immature Granulocyte 0 %      Nucleated RBC . 0 /100 WBC      Neutrophils Absolute 11.73 (H) x10 3/uL      Abs Lymph Automated 0.66 x10 3/uL      Abs Mono Automated 0.46 x10 3/uL      Abs Eos Automated 0.01 x10 3/uL      Absolute Baso Automated 0.01 x10 3/uL      Absolute Immature Granulocyte 0.02 x10 3/uL     UA with Reflex Micro [[id number]]  (Abnormal) Collected:05/20/11 1631    Specimen Information:Urine Updated:05/20/11 1648     Urine Type Clean Catch      Color, UA Yellow      Clarity, UA Clear      Specific Gravity, UR 1.016      Urine pH 6.0      Leukocytes, UA Negative      Nitrite, UR Negative      Protein, UR Trace (A)      Urine Glucose Negative      Ketones, UR Negative      Urobilinogen, UA Normal mg/dL      Bilirubin, UR Negative      Urine Blood Negative      RBC, UA 0 - 5 /HPF      WBC, UA 0 - 5 /HPF     i-Stat CG4 Venous CartrIDge [[id number]] Collected:05/20/11 1624     i-STAT pH Venous 7.415 Updated:05/20/11 1643     i-STAT pCO2 Venous 35.0 mmHg  i-STAT pO2 Venous 38.0 mmHg      i-STAT HCO3 Bicarbonate Venous 22.2 mEq/L      i-STAT Total CO2 Venous 23.0 mEq/L      i-STAT Base Excess Venous -1.0 mEq/L      i-STAT O2 Saturation Venous 70.0 %      i-STAT Lactic Acid 1.8 mEq/L      i-STAT Patient Temperature 100.9      i-STAT FIO2 21      i-STAT Allen's Test NA      i-STAT Draw Site Venous         Assessment:  (Fevers/chills  gerd  hpl  ).       Plan:   (Blood cult in  er done started levaquin and flagyl  Clinically stable   D/c home f/u with dr Myrtis Ser ).       Schuyler Amor  05/21/2011

## 2011-05-22 LAB — LAB USE ONLY - HISTORICAL SURGICAL PATHOLOGY

## 2011-05-26 NOTE — Anesthesia Postprocedure Evaluation (Signed)
Anesthesia Post Evaluation    Patient: [name] Young    Procedures performed: Procedure(s) with comments:  COLONOSCOPY - GELAB/OP/IVA    Anesthesia type: General TIVA    Patient location:Phase II PACU    Last vitals:   Filed Vitals:    05/20/11 1038   BP:    Pulse: 76   Temp:    Resp: 18       Post pain: Patient not complaining of pain, continue current therapy     Mental Status:awake    Respiratory Function: tolerating room air    Cardiovascular: stable    Nausea/Vomiting: patient not complaining of nausea or vomiting    Hydration Status: adequate    Regional Anesthesia: full neurologic recovery, none    Post assessment: no apparent anesthetic complications

## 2011-08-21 ENCOUNTER — Emergency Department
Admission: EM | Admit: 2011-08-21 | Discharge: 2011-08-21 | Disposition: A | Discharge status: Home / Self Care | Payer: BLUE CROSS/BLUE SHIELD | Attending: Internal Medicine | Admitting: Internal Medicine

## 2011-08-21 ENCOUNTER — Emergency Department: Payer: BLUE CROSS/BLUE SHIELD

## 2011-08-21 DIAGNOSIS — S139XXA Sprain of joints and ligaments of unspecified parts of neck, initial encounter: Secondary | ICD-10-CM | POA: Insufficient documentation

## 2011-08-21 DIAGNOSIS — K219 Gastro-esophageal reflux disease without esophagitis: Secondary | ICD-10-CM | POA: Insufficient documentation

## 2011-08-21 DIAGNOSIS — I10 Essential (primary) hypertension: Secondary | ICD-10-CM | POA: Insufficient documentation

## 2011-08-21 DIAGNOSIS — G35 Multiple sclerosis: Secondary | ICD-10-CM | POA: Insufficient documentation

## 2011-08-21 DIAGNOSIS — S161XXA Strain of muscle, fascia and tendon at neck level, initial encounter: Secondary | ICD-10-CM

## 2011-08-21 DIAGNOSIS — E785 Hyperlipidemia, unspecified: Secondary | ICD-10-CM | POA: Insufficient documentation

## 2011-08-21 HISTORY — DX: Unspecified osteoarthritis, unspecified site: M19.90

## 2011-08-21 HISTORY — DX: Herpesviral infection, unspecified: B00.9

## 2011-08-21 MED ORDER — OXYCODONE-ACETAMINOPHEN 5-325 MG PO TABS
ORAL_TABLET | ORAL | Status: AC
Start: 2011-08-21 — End: 2011-08-31

## 2011-08-21 MED ORDER — CYCLOBENZAPRINE HCL 10 MG PO TABS
10.0000 mg | ORAL_TABLET | Freq: Three times a day (TID) | ORAL | Status: AC | PRN
Start: 2011-08-21 — End: 2011-09-05
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
## 2011-08-21 NOTE — Discharge Instructions (Signed)
Follow up with your doctor
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
## 2011-09-12 ENCOUNTER — Emergency Department
Admission: EM | Admit: 2011-09-12 | Discharge: 2011-09-12 | Disposition: A | Discharge status: Home / Self Care | Payer: BLUE CROSS/BLUE SHIELD | Attending: Internal Medicine | Admitting: Internal Medicine

## 2011-09-12 ENCOUNTER — Emergency Department: Payer: BLUE CROSS/BLUE SHIELD

## 2011-09-12 DIAGNOSIS — B009 Herpesviral infection, unspecified: Secondary | ICD-10-CM | POA: Insufficient documentation

## 2011-09-12 DIAGNOSIS — M1A00X0 Idiopathic chronic gout, unspecified site, without tophus (tophi): Secondary | ICD-10-CM | POA: Insufficient documentation

## 2011-09-12 DIAGNOSIS — K529 Noninfective gastroenteritis and colitis, unspecified: Secondary | ICD-10-CM

## 2011-09-12 DIAGNOSIS — K449 Diaphragmatic hernia without obstruction or gangrene: Secondary | ICD-10-CM | POA: Insufficient documentation

## 2011-09-12 DIAGNOSIS — E785 Hyperlipidemia, unspecified: Secondary | ICD-10-CM | POA: Insufficient documentation

## 2011-09-12 DIAGNOSIS — G35 Multiple sclerosis: Secondary | ICD-10-CM | POA: Insufficient documentation

## 2011-09-12 DIAGNOSIS — M1A9XX0 Chronic gout, unspecified, without tophus (tophi): Secondary | ICD-10-CM | POA: Insufficient documentation

## 2011-09-12 DIAGNOSIS — K219 Gastro-esophageal reflux disease without esophagitis: Secondary | ICD-10-CM | POA: Insufficient documentation

## 2011-09-12 DIAGNOSIS — K5289 Other specified noninfective gastroenteritis and colitis: Secondary | ICD-10-CM | POA: Insufficient documentation

## 2011-09-12 DIAGNOSIS — M129 Arthropathy, unspecified: Secondary | ICD-10-CM | POA: Insufficient documentation

## 2011-09-12 DIAGNOSIS — I10 Essential (primary) hypertension: Secondary | ICD-10-CM | POA: Insufficient documentation

## 2011-09-12 DIAGNOSIS — N400 Enlarged prostate without lower urinary tract symptoms: Secondary | ICD-10-CM | POA: Insufficient documentation

## 2011-09-12 LAB — MAN DIFF ONLY
Band Neutrophils Absolute: 0.96 10*3/uL (ref 0.00–1.00)
Band Neutrophils: 9 % (ref 0–9)
Basophils Absolute Manual: 0 10*3/uL (ref 0.00–0.20)
Basophils Manual: 0 % (ref 0–2)
Eosinophils Absolute Manual: 0 10*3/uL (ref 0.00–0.70)
Eosinophils Manual: 0 % (ref 0–5)
Lymphocytes Absolute Manual: 0 10*3/uL — ABNORMAL LOW (ref 0.50–4.40)
Lymphocytes Manual: 0 % — ABNORMAL LOW (ref 15–41)
Monocytes Absolute: 0 10*3/uL (ref 0.00–1.20)
Monocytes Manual: 0 % — ABNORMAL LOW (ref 1–11)
Neutrophils Absolute Manual: 9.66 10*3/uL — ABNORMAL HIGH (ref 1.80–8.10)
Nucleated RBC: 0 /100 WBC
Segmented Neutrophils: 91 % — ABNORMAL HIGH (ref 52–75)

## 2011-09-12 LAB — BASIC METABOLIC PANEL
BUN: 27 mg/dL — ABNORMAL HIGH (ref 9.0–21.0)
CO2: 20 mEq/L — ABNORMAL LOW (ref 22–31)
Calcium: 10.1 mg/dL (ref 8.6–10.2)
Chloride: 102 mEq/L (ref 98–107)
Creatinine: 1 mg/dL (ref 0.5–1.4)
Glucose: 119 mg/dL — ABNORMAL HIGH (ref 70–100)
Potassium: 4.7 mEq/L (ref 3.6–5.0)
Sodium: 140 mEq/L (ref 136–143)

## 2011-09-12 LAB — CBC AND DIFFERENTIAL
Hematocrit: 45.4 % (ref 42.0–52.0)
Hgb: 15.7 g/dL (ref 13.0–17.0)
MCH: 28.4 pg (ref 28.0–32.0)
MCHC: 34.6 g/dL (ref 32.0–36.0)
MCV: 82.2 fL (ref 80.0–100.0)
MPV: 9.3 fL — ABNORMAL LOW (ref 9.4–12.3)
Platelets: 284 10*3/uL (ref 140–400)
RBC: 5.52 10*6/uL (ref 4.70–6.00)
RDW: 13 % (ref 12–15)
WBC: 10.62 10*3/uL (ref 3.50–10.80)

## 2011-09-12 LAB — GFR: EGFR: >60

## 2011-09-12 MED ORDER — SODIUM CHLORIDE 0.9 % IV BOLUS
1000.00 mL | Freq: Once | INTRAVENOUS | Status: AC
Start: 2011-09-12 — End: 2011-09-12
  Administered 2011-09-12: 1000 mL via INTRAVENOUS

## 2011-09-12 MED ORDER — ONDANSETRON HCL 4 MG/2ML IJ SOLN
4.00 mg | Freq: Once | INTRAMUSCULAR | Status: AC
Start: 2011-09-12 — End: 2011-09-12
  Administered 2011-09-12: 4 mg via INTRAVENOUS
  Filled 2011-09-12 (×2): qty 2

## 2011-09-12 MED ORDER — ONDANSETRON HCL 4 MG/2ML IJ SOLN
4.00 mg | Freq: Once | INTRAMUSCULAR | Status: AC
Start: 2011-09-12 — End: 2011-09-12
  Administered 2011-09-12: 4 mg via INTRAVENOUS

## 2011-09-12 MED ORDER — PROMETHAZINE HCL 25 MG PO TABS
25.00 mg | ORAL_TABLET | Freq: Four times a day (QID) | ORAL | Status: AC | PRN
Start: 2011-09-12 — End: 2011-09-19

## 2011-09-12 MED ORDER — ONDANSETRON 4 MG PO TBDP
4.00 mg | ORAL_TABLET | Freq: Four times a day (QID) | ORAL | Status: AC | PRN
Start: 2011-09-12 — End: 2011-09-19

## 2011-09-12 NOTE — ED Notes (Signed)
Pt C/O: nausea/vomiting (times 5 since onset), fever, diarrhea (times 20 since onset).  Pt reports onset 0500 hours.

## 2011-09-12 NOTE — ED Provider Notes (Addendum)
History     Chief Complaint   Patient presents with   . Nausea     vomiting, fever     Patient is a 61 y.o. male presenting with vomiting and diarrhea. The history is provided by the patient.   Emesis   This is a new problem. The current episode started yesterday. The problem occurs 5 to 10 times per day. The emesis has an appearance of stomach contents. There has been no fever. Associated symptoms include abdominal pain and diarrhea. Pertinent negatives include no fever.   Diarrhea  The primary symptoms include fatigue, abdominal pain, nausea, vomiting and diarrhea. Primary symptoms do not include fever. The illness began yesterday.   The diarrhea occurs more than 10 times per day.       Past Medical History   Diagnosis Date   . Hypertension    . Hyperlipidemia    . GERD (gastroesophageal reflux disease)    . Hiatal hernia    . Multiple sclerosis    . Chronic gout    . Diverticulosis of colon without diverticulitis    . Prostate enlargement    . Herpes simplex without mention of complication    . Arthritis    . Reflux        Past Surgical History   Procedure Date   . Tonsillectomy      at age 47   . Colonoscopy t   . Eye surgery 6/12     bil cataracts removed 6/12       Family History   Problem Relation Age of Onset   . Cancer Mother    . Heart disease Father    . Cancer Father        Current Facility-Administered Medications   Medication Dose Route Frequency Provider Last Rate Last Dose   . ondansetron (ZOFRAN) injection 4 mg  4 mg Intravenous Once Georgiann Cocker, MD       . sodium chloride 0.9 % bolus 1,000 mL  1,000 mL Intravenous Once Georgiann Cocker, MD 1,000 mL/hr at 09/12/11 1611 1,000 mL at 09/12/11 1611     Current Outpatient Prescriptions   Medication Sig Dispense Refill   . acyclovir (ZOVIRAX) 200 MG capsule Take 200 mg by mouth daily.         Marland Kitchen aspirin 81 MG tablet Take 81 mg by mouth daily.         Marland Kitchen atropine-PHENobarbital-scopolamine-hyoscyamine (DONNATAL) 16.2 MG per tablet Take 1 tablet  by mouth 2 (two) times daily.         . Cholecalciferol ([employer]S VIT D 5000 HIGH-POTENCY) 5000 UNITS capsule Take 5,000 Units by mouth daily.         Marland Kitchen doxazosin (CARDURA) 2 MG tablet Take 2 mg by mouth nightly.         . ergocalciferol (ERGOCALCIFEROL) 50000 UNITS capsule Take 5,000 Units by mouth daily.         . Febuxostat (ULORIC) 40 MG tablet Take 40 mg by mouth daily.         . Fingolimod HCl (GILENYA PO) Take by mouth.         Marland Kitchen gemfibrozil (LOPID) 600 MG tablet Take 600 mg by mouth 2 (two) times daily before meals.         . Glucosamine Sulfate 750 MG CAPS Take 750 mg by mouth 2 (two) times daily.         Marland Kitchen glucosamine-chondroitin 500-400 MG tablet Take 3 tablets by mouth 2 (two)  times daily.         . lansoprazole (PREVACID) 15 MG capsule Take 15 mg by mouth daily.         Marland Kitchen lisinopril (PRINIVIL,ZESTRIL) 10 MG tablet Take 10 mg by mouth daily.         . Multiple Vitamin (MULTIVITAMIN) tablet Take 1 tablet by mouth daily.         . ranitidine (ZANTAC) 300 MG capsule Take 300 mg by mouth every evening.             Allergies   Allergen Reactions   . Erythromycin Fever   . Allopurinol Hives       History   Substance Use Topics   . Smoking status: Never Smoker    . Smokeless tobacco: Not on file   . Alcohol Use: No       Review of Systems   Constitutional: Positive for fatigue. Negative for fever.   Gastrointestinal: Positive for nausea, vomiting, abdominal pain and diarrhea.   All other systems reviewed and are negative.        Physical Exam   BP 138/95  Pulse 105  Temp(Src) 97 F (36.1 C) (Oral)  Resp 16  SpO2 95%    Physical Exam   Nursing note and vitals reviewed.  Constitutional: He is oriented to person, place, and time. He appears well-developed and well-nourished.   HENT:   Mouth/Throat: Oropharynx is clear and moist.   Eyes: Conjunctivae are normal. Pupils are equal, round, and reactive to light. No scleral icterus.   Neck: Normal range of motion. Neck supple.   Cardiovascular: Normal rate, regular  rhythm, normal heart sounds and intact distal pulses.    Pulmonary/Chest: Effort normal and breath sounds normal.   Abdominal: Soft. Bowel sounds are normal. He exhibits no distension. There is no rebound and no guarding.        Mild tenderness across mid abdomen mostly to left   Genitourinary:        nontender   Musculoskeletal: Normal range of motion. He exhibits no edema and no tenderness.   Neurological: He is alert and oriented to person, place, and time.   Skin: Skin is warm and dry. No rash noted.   Psychiatric: He has a normal mood and affect.       ED Course   Procedures    MDM       feels better post fluids. Tired feeling, no abd pain, taking oral fluids    Georgiann Cocker, MD  09/12/11 2725    Georgiann Cocker, MD  09/13/11 1018
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
## 2012-07-22 ENCOUNTER — Other Ambulatory Visit (INDEPENDENT_AMBULATORY_CARE_PROVIDER_SITE_OTHER): Payer: Self-pay

## 2012-07-22 ENCOUNTER — Encounter (INDEPENDENT_AMBULATORY_CARE_PROVIDER_SITE_OTHER): Payer: Self-pay | Admitting: Nurse Practitioner

## 2012-07-22 DIAGNOSIS — R079 Chest pain, unspecified: Secondary | ICD-10-CM

## 2012-07-22 DIAGNOSIS — I10 Essential (primary) hypertension: Secondary | ICD-10-CM

## 2012-07-25 ENCOUNTER — Ambulatory Visit: Payer: BLUE CROSS/BLUE SHIELD | Attending: Cardiology | Admitting: Cardiovascular Disease

## 2012-07-25 VITALS — Wt 188.7 lb

## 2012-07-25 DIAGNOSIS — R079 Chest pain, unspecified: Secondary | ICD-10-CM

## 2012-07-25 DIAGNOSIS — I10 Essential (primary) hypertension: Secondary | ICD-10-CM | POA: Insufficient documentation

## 2012-07-25 MED ORDER — TECHNETIUM TC 99M TETROFOSMIN INJECTION
1.00 | Freq: Once | Status: AC
Start: 2012-07-25 — End: 2012-07-25
  Administered 2012-07-25: 1 via INTRAVENOUS
  Filled 2012-07-25: qty 1

## 2012-07-25 NOTE — Procedures (Signed)
Re: [name] Young: Male      DOB: Nov 09, 1950      Study #  [id number]   Referring Physician:  Nelida Gores, MD       Rendering Physician:  Payton Doughty, MD  Test Date:  07/25/2012    Interpretation Date: 07/25/2012   Reasons for the study:   1. Chest pain    2. HTN (hypertension)      Sestamibi Perfusion Study:  The patient was injected with 9.86  mCi of Myoview (IV Location (Rest): Rt/AC) and SPECT imaging was begun approximately 30 minutes post injection. The camera obtained images with a 180-degree orbit around the patient's heart. After the rest images, the patient was then prepped for an exercise stress test. One minute prior to the termination of exercise, the patient was injected intravenously with 36  mCi of Myoview (IV Location (Stress): Rt/AC). Gated SPECT imaging was started at least 30 minutes post injection. The patient was placed in the supine position and the images were obtained with a 180-degree orbit around the patient's heart while gating to the patient's ECG. The study was then processed and displayed for evaluation.    Exercise:  Resting ECG:  Normal sinus rhythm; normal tracing.  Blood Pressure:  Rest = 134/82  Maximum = 182/68  Heart Rate:   Rest = 75   Maximum = 146   Heart rate 142 (89% of MPHR) at time of isotope injection.  Exercise stopped because of fatigue and calf discomfort.  No chest pain.  No arrhythmias.  1-2 mm horizontal to upsloping ST depressions in inferior and lateral precordial leads at peak exercise, resolving during recovery.  Symptomatically negative, electrocardiographically equivocal, maximal treadmill exercise stress test.    Scintigraphic Interpretation:  The study is of good technical quality, with no evidence of significant motion artifact nor of significant soft tissue attenuation artifact.  At rest, there appears to be normal and symmetric distribution of activity throughout the left ventricular myocardium.  Following exercise, the images appear similar  to those at rest.  Left ventricular size is normal.  Left ventricular ejection fraction is normal (67% at rest and 68% post exercise, normal being 50% or greater).  No regional wall motion abnormalities are evident.     Conclusions:  1.  Normal scintigraphy, without evidence of fixed or inducible perfusion defects.  2.  Normal left ventricular size.  3.  Normal left ventricular ejection fraction (67%), without regional wall motion abnormalities.  4.  No prior studies are available for comparison.     Interpreted by: Payton Doughty, MD, [facility], [facility]  Myoview Activity: 9.86  mCi at 1234 (IV Location (Rest): Rt/AC)   Myoview Activity: 36  mCi at 1354 (IV Location (Stress): Rt/AC)

## 2012-07-27 ENCOUNTER — Telehealth (INDEPENDENT_AMBULATORY_CARE_PROVIDER_SITE_OTHER): Payer: Self-pay

## 2012-07-27 NOTE — Telephone Encounter (Signed)
the patient called for NUC results.   NUC normal  Patient aware

## 2012-07-29 ENCOUNTER — Encounter (INDEPENDENT_AMBULATORY_CARE_PROVIDER_SITE_OTHER): Payer: Self-pay | Admitting: Nurse Practitioner

## 2012-08-01 ENCOUNTER — Encounter (INDEPENDENT_AMBULATORY_CARE_PROVIDER_SITE_OTHER): Payer: Self-pay | Admitting: Nurse Practitioner

## 2012-08-01 ENCOUNTER — Ambulatory Visit (INDEPENDENT_AMBULATORY_CARE_PROVIDER_SITE_OTHER): Payer: BLUE CROSS/BLUE SHIELD | Admitting: Nurse Practitioner

## 2012-08-01 VITALS — BP 140/70 | HR 80 | Ht 70.0 in | Wt 188.4 lb

## 2012-08-01 DIAGNOSIS — E785 Hyperlipidemia, unspecified: Secondary | ICD-10-CM

## 2012-08-01 DIAGNOSIS — I10 Essential (primary) hypertension: Secondary | ICD-10-CM

## 2012-08-01 NOTE — Progress Notes (Signed)
[facility] VISIT    I had the pleasure of seeing [name] today for cardiovascular follow up. He is a pleasant 62 y.o. male with a history of HTN and hyperlipidemia who presents to discuss the result of his Nuc stress test that.   Few weeks ago, he was having chest discomfort, he was seen by his PCP, who ordered the stress test. He had a work up including EKG that showed NSR.   He reports that his symptoms have subsided since and that he is follow up with a GI specialist for GI work up. He has a history for irritable bowl syndrome and GERD. He started taking Prevacid, which is helping with his ymptoms.   He will follow up with GI specialist once he comes back from his trip.   Denies CP, shortness of breath, heart palpitations, dizziness, lightheadedness, syncope, and claudicaiton.       MEDICATIONS: He has a current medication list which includes the following prescription(s): acyclovir - Take 200 mg by mouth daily, aspirin - Take 81 mg by mouth daily, atropine-phenobarbital-scopolamine-hyoscyamine - Take 1 tablet by mouth 2 (two) times daily, cholecalciferol - Take 5,000 Units by mouth daily, doxazosin - Take 2 mg by mouth nightly, febuxostat - Take 40 mg by mouth daily, fenofibrate - Take 160 mg by mouth daily, fingolimod hcl - Take by mouth, glucosamine sulfate - Take 750 mg by mouth 2 (two) times daily, lansoprazole - Take 15 mg by mouth daily, lisinopril - Take 10 mg by mouth daily, multivitamin - Take 1 tablet by mouth daily, ranitidine - Take 300 mg by mouth every evening, ergocalciferol - Take 5,000 Units by mouth daily, gemfibrozil - Take 600 mg by mouth 2 (two) times daily before meals, and glucosamine-chondroitin - Take 3 tablets by mouth 2 (two) times daily.    REVIEW OF SYSTEMS: All other systems reviewed and negative except as stated above.    PHYSICAL EXAMINATION  General Appearance: A well-appearing male in no acute distress.   Vital Signs: BP 140/70  Pulse 80  Ht 1.778 m  (5\' 10" )  Wt 85.458 kg (188 lb 6.4 oz)  BMI 27.03 kg/m2   HEENT: Sclera anicteric, conjunctiva without pallor, moist mucous membranes, normal dentition.   Neck: Supple without jugular venous distention. Thyroid nonpalpable. Normal carotid upstrokes without bruits.  Chest: Clear to auscultation bilaterally with good air movement and respiratory effort and no wheezes, rales, or rhonchi  Cardiovascular: Normal S1 and physiologically split S2 without murmurs, gallops or rub. PMI of normal size and nondisplaced.   Abdomen: Soft, nontender. No organomegaly.  No pulsatile masses or bruits.    Extremities: Warm without edema. All peripheral pulses are full and equal.  Skin: No rash, xanthoma or xanthelasma.   Neuro: Alert and oriented x3. Grossly intact. Strength is symmetrical. Normal mood and affect.   Nuc Stress test: Normal left ventricular size, EF 67% without regional wall motion abnormalities.         IMPRESSION/RECOMMENDATIONS: [name] is a 61 y.o. male who presents for follow up.     1. HTN: Well controlled on Lisinopril, cardura    2. Hyperlipidemia: Fenofibrate:     3. Chest discomfort: resolved. We will request all recent Labs EKG, and any other work up from PCP office.   4. Nuc stress test: Normal left ventricular size, EF 67% without regional wall motion abnormalities

## 2013-07-06 HISTORY — PX: COLONOSCOPY: SHX174

## 2013-12-29 ENCOUNTER — Other Ambulatory Visit: Payer: Self-pay

## 2013-12-29 DIAGNOSIS — M545 Low back pain: Secondary | ICD-10-CM

## 2013-12-30 ENCOUNTER — Ambulatory Visit: Payer: BLUE CROSS/BLUE SHIELD

## 2013-12-30 DIAGNOSIS — M545 Low back pain: Secondary | ICD-10-CM

## 2013-12-30 DIAGNOSIS — M47817 Spondylosis without myelopathy or radiculopathy, lumbosacral region: Secondary | ICD-10-CM | POA: Insufficient documentation

## 2013-12-30 DIAGNOSIS — N281 Cyst of kidney, acquired: Secondary | ICD-10-CM | POA: Insufficient documentation

## 2014-07-06 HISTORY — PX: EUS (ENDOSCOPIC ULTRASOUND): SHX3911

## 2014-09-12 ENCOUNTER — Telehealth (INDEPENDENT_AMBULATORY_CARE_PROVIDER_SITE_OTHER): Payer: Self-pay

## 2014-09-12 DIAGNOSIS — I10 Essential (primary) hypertension: Secondary | ICD-10-CM

## 2014-09-12 DIAGNOSIS — R079 Chest pain, unspecified: Secondary | ICD-10-CM

## 2014-09-12 DIAGNOSIS — E7800 Pure hypercholesterolemia, unspecified: Secondary | ICD-10-CM

## 2014-09-12 NOTE — Telephone Encounter (Signed)
Left message w/pt to call back   Pt need nuk - insurance may require OV first; if so, asap with NP

## 2014-09-12 NOTE — Telephone Encounter (Signed)
Pt on travel; went to ED with CP  Dr Andrey Campanile spoke w/Dr Lyman Bishop; ordering Nuk

## 2014-09-14 NOTE — Telephone Encounter (Signed)
Pt has OV with Dr Lyman Bishop for 3/14

## 2014-09-17 ENCOUNTER — Ambulatory Visit (INDEPENDENT_AMBULATORY_CARE_PROVIDER_SITE_OTHER): Payer: BLUE CROSS/BLUE SHIELD | Admitting: Cardiology

## 2014-09-17 ENCOUNTER — Encounter (INDEPENDENT_AMBULATORY_CARE_PROVIDER_SITE_OTHER): Payer: Self-pay | Admitting: Cardiology

## 2014-09-17 VITALS — BP 144/80 | HR 71 | Ht 70.0 in | Wt 183.0 lb

## 2014-09-17 DIAGNOSIS — R079 Chest pain, unspecified: Secondary | ICD-10-CM

## 2014-09-17 DIAGNOSIS — I10 Essential (primary) hypertension: Secondary | ICD-10-CM

## 2014-09-17 NOTE — Patient Instructions (Signed)
Schedule nuclear stress test
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
## 2014-09-24 ENCOUNTER — Ambulatory Visit
Admission: RE | Admit: 2014-09-24 | Discharge: 2014-09-24 | Disposition: A | Discharge status: Home / Self Care | Payer: BLUE CROSS/BLUE SHIELD | Source: Ambulatory Visit | Attending: Cardiology | Admitting: Cardiology

## 2014-09-24 DIAGNOSIS — R079 Chest pain, unspecified: Secondary | ICD-10-CM

## 2014-09-24 DIAGNOSIS — I10 Essential (primary) hypertension: Secondary | ICD-10-CM | POA: Insufficient documentation

## 2014-09-24 DIAGNOSIS — Z029 Encounter for administrative examinations, unspecified: Secondary | ICD-10-CM

## 2014-09-24 DIAGNOSIS — R0789 Other chest pain: Secondary | ICD-10-CM | POA: Insufficient documentation

## 2014-09-24 MED ORDER — TECHNETIUM TC 99M TETROFOSMIN INJECTION
1.0000 | Freq: Once | Status: AC | PRN
Start: 2014-09-24 — End: 2014-09-24
  Administered 2014-09-24: 1 via INTRAVENOUS
  Filled 2014-09-24: qty 1

## 2014-09-26 ENCOUNTER — Ambulatory Visit (INDEPENDENT_AMBULATORY_CARE_PROVIDER_SITE_OTHER): Payer: BLUE CROSS/BLUE SHIELD | Admitting: Cardiology

## 2014-09-28 ENCOUNTER — Telehealth (INDEPENDENT_AMBULATORY_CARE_PROVIDER_SITE_OTHER): Payer: Self-pay

## 2014-09-28 NOTE — Telephone Encounter (Signed)
Left message to call back. Nuclear stress test unchanged compared to previous study in 2014.

## 2014-09-28 NOTE — Telephone Encounter (Signed)
Patient aware.

## 2014-10-05 DIAGNOSIS — K859 Acute pancreatitis without necrosis or infection, unspecified: Secondary | ICD-10-CM

## 2014-10-05 HISTORY — DX: Acute pancreatitis without necrosis or infection, unspecified: K85.90

## 2015-05-27 ENCOUNTER — Other Ambulatory Visit: Payer: Self-pay | Admitting: Neurology

## 2015-07-04 ENCOUNTER — Other Ambulatory Visit: Payer: Self-pay | Admitting: Gastroenterology

## 2015-09-27 ENCOUNTER — Other Ambulatory Visit: Payer: Self-pay

## 2016-01-24 ENCOUNTER — Ambulatory Visit: Payer: BLUE CROSS/BLUE SHIELD

## 2016-01-24 NOTE — Pre-Procedure Instructions (Signed)
   No testing requested/required

## 2016-01-27 NOTE — Pre-Procedure Instructions (Addendum)
   Card records in epic- LOV, ekg 09/17/14;stress 09/24/14    call to neuro office    Spoke to [facility]    req fax LOV [phone] to [phone]

## 2016-02-10 NOTE — Pre-Procedure Instructions (Signed)
   Rev neuro LOV 11/15/15

## 2016-02-13 ENCOUNTER — Ambulatory Visit: Payer: BLUE CROSS/BLUE SHIELD | Admitting: Anesthesiology

## 2016-02-13 ENCOUNTER — Encounter
Admission: RE | Disposition: A | Discharge status: Home / Self Care | Payer: Self-pay | Source: Ambulatory Visit | Attending: Gastroenterology

## 2016-02-13 ENCOUNTER — Ambulatory Visit: Payer: BLUE CROSS/BLUE SHIELD | Admitting: Gastroenterology

## 2016-02-13 ENCOUNTER — Ambulatory Visit
Admission: RE | Admit: 2016-02-13 | Discharge: 2016-02-13 | Disposition: A | Discharge status: Home / Self Care | Payer: BLUE CROSS/BLUE SHIELD | Source: Ambulatory Visit | Attending: Gastroenterology | Admitting: Gastroenterology

## 2016-02-13 ENCOUNTER — Ambulatory Visit: Payer: Self-pay

## 2016-02-13 DIAGNOSIS — Z800 Family history of malignant neoplasm of digestive organs: Secondary | ICD-10-CM | POA: Insufficient documentation

## 2016-02-13 DIAGNOSIS — K219 Gastro-esophageal reflux disease without esophagitis: Secondary | ICD-10-CM | POA: Insufficient documentation

## 2016-02-13 DIAGNOSIS — E785 Hyperlipidemia, unspecified: Secondary | ICD-10-CM | POA: Insufficient documentation

## 2016-02-13 DIAGNOSIS — K5730 Diverticulosis of large intestine without perforation or abscess without bleeding: Secondary | ICD-10-CM | POA: Insufficient documentation

## 2016-02-13 DIAGNOSIS — G35 Multiple sclerosis: Secondary | ICD-10-CM | POA: Insufficient documentation

## 2016-02-13 DIAGNOSIS — R197 Diarrhea, unspecified: Secondary | ICD-10-CM

## 2016-02-13 DIAGNOSIS — K635 Polyp of colon: Secondary | ICD-10-CM | POA: Insufficient documentation

## 2016-02-13 DIAGNOSIS — Z86010 Personal history of colonic polyps: Secondary | ICD-10-CM | POA: Insufficient documentation

## 2016-02-13 DIAGNOSIS — Z7982 Long term (current) use of aspirin: Secondary | ICD-10-CM | POA: Insufficient documentation

## 2016-02-13 DIAGNOSIS — K449 Diaphragmatic hernia without obstruction or gangrene: Secondary | ICD-10-CM | POA: Insufficient documentation

## 2016-02-13 DIAGNOSIS — K529 Noninfective gastroenteritis and colitis, unspecified: Secondary | ICD-10-CM | POA: Insufficient documentation

## 2016-02-13 DIAGNOSIS — K5790 Diverticulosis of intestine, part unspecified, without perforation or abscess without bleeding: Secondary | ICD-10-CM

## 2016-02-13 DIAGNOSIS — I10 Essential (primary) hypertension: Secondary | ICD-10-CM | POA: Insufficient documentation

## 2016-02-13 DIAGNOSIS — K648 Other hemorrhoids: Secondary | ICD-10-CM | POA: Insufficient documentation

## 2016-02-13 HISTORY — PX: COLONOSCOPY: SHX174

## 2016-02-13 SURGERY — DONT USE, USE 1094-COLONOSCOPY, DIAGNOSTIC (SCREENING)
Anesthesia: Anesthesia General | Site: Anus

## 2016-02-13 MED ORDER — PROPOFOL INFUSION 10 MG/ML
INTRAVENOUS | Status: DC | PRN
Start: 2016-02-13 — End: 2016-02-13
  Administered 2016-02-13: 140 ug/kg/min via INTRAVENOUS

## 2016-02-13 MED ORDER — PROPOFOL 10 MG/ML IV EMUL (WRAP)
INTRAVENOUS | Status: DC | PRN
Start: 2016-02-13 — End: 2016-02-13
  Administered 2016-02-13: 60 mg via INTRAVENOUS

## 2016-02-13 MED ORDER — LACTATED RINGERS IV SOLN
INTRAVENOUS | Status: DC
Start: 2016-02-13 — End: 2016-02-13
  Administered 2016-02-13: 1000 mL via INTRAVENOUS

## 2016-02-13 MED ORDER — PROPOFOL 10 MG/ML IV EMUL (WRAP)
INTRAVENOUS | Status: AC
Start: 2016-02-13 — End: ?
  Filled 2016-02-13: qty 50

## 2016-02-13 SURGICAL SUPPLY — 17 items
APPLICATOR PROCTOSCOPIC 16IN (Applicator) ×2 IMPLANT
CONTAINER SPEC LLDPE 16OZ W LF NS LEK (Procedure Accessories) ×2 IMPLANT
CONTAINER SPECIMAN 16OZ W LID (Procedure Accessories) ×2
CONTAINER SPECIMEN C16 OZ WIDE LEAK SHATTER RESISTANT SNAP ON LID (Procedure Accessories) ×2 IMPLANT
FORCEP BIOPSY 240CM RADIAL JA (Instrument) ×2 IMPLANT
FORCEP BIOPSY 240CM RADIALJAW (Instrument) ×2 IMPLANT
GLOVE NITRILE PREMIERPRO MED (Glove) ×6 IMPLANT
JELLY KY LUBRICATNG 2 OZ FLIP (Procedure Accessories) ×2 IMPLANT
PAD ELECTROSRG GRND REM W CRD (Procedure Accessories) ×2 IMPLANT
SYRINGE 50 ML GRADUATE NONPYROGENIC DEHP (Syringes, Needles) ×1 IMPLANT
SYRINGE 50 ML GRADUATE NONPYROGENIC DEHP FREE PVC FREE BD MEDICAL (Syringes, Needles) ×1 IMPLANT
SYRINGE MED 50ML LF STRL GRAD N-PYRG (Syringes, Needles) ×1
SYRINGE SLIP-TIP 60CC (Syringes, Needles) ×1
TUBING CONNECTING STERILE 10FT (Tubing) ×1
TUBING SCT PVC ARG 3/16IN 10FT LF STRL (Tubing) ×1
TUBING SUCTION ID3/16 IN L10 FT (Tubing) ×1
TUBING SUCTION ID3/16 IN L10 FT NONCONDUCTIVE STRAIGHT MALE FEMALE (Tubing) ×1 IMPLANT

## 2016-02-13 NOTE — Anesthesia Postprocedure Evaluation (Signed)
Anesthesia Post Evaluation    Patient: [name] Young    Procedure(s) with comments:  COLONOSCOPY - COLONOSCOPY W/IVA    Anesthesia type: general    Last Vitals:   Filed Vitals:    02/13/16 1234   BP: 113/56   Pulse: 61   Temp:    Resp: 16   SpO2: 99%       Patient Location: Phase II PACU      Post Pain: Patient not complaining of pain, continue current therapy    Mental Status: awake and alert    Respiratory Function: tolerating room air    Cardiovascular: stable    Nausea/Vomiting: patient not complaining of nausea or vomiting    Hydration Status: adequate    Post Assessment: no apparent anesthetic complications, no evidence of recall and no reportable events          Anesthesia Qualified Clinical Data Registry    Central Line      CVC insertion : NO                                               Perioperative temperature management      General/neuraxial anesthesia > or = 60 minutes (excluding CABG) : NO                                Administration of antibiotic prophylaxis      Age > or = 18, with IV access, with surgical procedure for which antibiotic prophylaxis indicated, and not on chronic antibiotics : NO                  Medication Administration      Ordering or administration of drug inconsistent with intended drug, dose, delivery or timing : NO      Dental loss/damage      Dental injury with administration of anesthesia : NO      Difficult intubation due to unrecognized difficult airway        Elective airway procedure including but not limited to: tracheostomy, fiberoptic bronchoscopy, rigid bronchoscopy; jet ventilation; or elective use of a device to facilitate airway management such as a Glidescope : NO                > Unanticipated difficult intubation post pre-evaluation : NO      Aspiration of gastric contents        Aspiration of gastric contents : NO                    Surgical fire        Procedure requiring electrocautery/laser : NO                    Immediate perioperative cardiac arrest         Cardiac arrest in OR or PACU : NO                    Unplanned hospital admission        Unplanned hospital admission for initially intended outpatient anesthesia service : NO      Unplanned ICU admission        Unplanned ICU admission related to anesthesia occurring within 24 hours of induction or start of MAC : NO  Surgical case cancellation        Cancellation of procedure after care already started by anesthesia care team : NO      Post-anesthesia transfer of care checklist/protocol to PACU        Transfer from OR to PACU upon case conclusion : YES              > Use of PACU transfer checklist/protocol : YES     (Includes the key elements of: patient identification, responsible practitioner identification (PACU nurse or advanced practitioner), discussion of pertinent history and procedure course, intraoperative anesthetic management, post-procedure plans, acknowledgement/questions)    Post-anesthesia transfer of care checklist/protocol to ICU        Transfer from OR to ICU upon case conclusion : NO                    Post-operative nausea/vomiting risk protocol        Post-operative nausea/vomiting risk protocol : NO  Patient > or = 18 with care initiated by anesthesia team that has a risk factor screen for post-op nausea/vomiting (Includes male, hx PONV, or motion sickness, non-smoker, intended opioid administration for post-op analgesia.)    Anaphylaxis        Anaphylaxis during anesthesia services : NO    (Inclusive of any suspected transfusion reaction in association with blood-bank confirmed blood product incompatibility)              Virgilio Frees, 02/13/2016 12:59 PM

## 2016-02-13 NOTE — Transfer of Care (Signed)
Anesthesia Transfer of Care Note    Patient: [name] Young    Procedures performed: Procedure(s) with comments:  COLONOSCOPY - COLONOSCOPY W/IVA    Anesthesia type: General TIVA    Patient location:PACU    Last vitals:   Filed Vitals:    02/13/16 1206   BP: 125/58   Pulse: 78   Temp:    Resp: 16   SpO2: 98%       Post pain: Patient not complaining of pain, continue current therapy      Mental Status:drowsy    Respiratory Function: tolerating room air    Cardiovascular: stable    Nausea/Vomiting: patient not complaining of nausea or vomiting    Hydration Status: adequate    Post assessment: no apparent anesthetic complications and no reportable events    Signed by: Michael Boston  02/13/2016 12:06 PM

## 2016-02-13 NOTE — H&P (Signed)
GI PRE PROCEDURE NOTE    Proceduralist Comments:   Review of Systems and Past Medical / Surgical History performed: Yes     Indications:Family history of colon cancer, History of colonic polyps and chronic diarrhea, and posative fecal globin IH test    Previous Adverse Reaction to Anesthesia or Sedation (if yes, describe): No    Physical Exam / Laboratory Data (If applicable)   Airway Classification: Class II    General: Alert and cooperative  Lungs: Lungs clear to auscultation  Cardiac: RRR, normal S1S2.    Abdomen: Soft, non tender. Normal active bowel sounds  Other:     No labs drawn    American Society of Anesthesiologists (ASA) Physical Status Classification:   ASA 2 - Patient with mild systemic disease with no functional limitations    Planned Sedation:   Deep sedation with anesthesia    Attestation:   [name] Young has been reassessed immediately prior to the procedure and is an appropriate candidate for the planned sedation and procedure. Risks, benefits and alternatives to the planned procedure and sedation have been explained to the patient or guardian:  yes        Signed by: Fanny Bien Brianda Beitler

## 2016-02-13 NOTE — Anesthesia Preprocedure Evaluation (Addendum)
Anesthesia Evaluation    AIRWAY    Mallampati: II         CARDIOVASCULAR    cardiovascular exam normal       DENTAL         PULMONARY    pulmonary exam normal     OTHER FINDINGS                      Anesthesia Plan    ASA 2     general                     intravenous induction   Detailed anesthesia plan: general IV      Post Op: post-op ventilation  Post op pain management: per surgeon

## 2016-02-13 NOTE — Discharge Instructions (Addendum)
Understanding Hemorrhoids    Hemorrhoid tissues are "cushions" of blood vessels that swell slightly during bowel movements. Too much pressure on the anal canal can make these tissues remain enlarged, become inflamed, and cause symptoms. This can happenboth inside and outside the anal canal.  Parts of the anal canal  The parts of the anal canal are:   Internal hemorrhoid tissueis in the upper area of the anal canal.   The rectumis the last several inches of the colon. This is where stool is stored prior to bowel movements.   Anal sphinctersare ring-shaped muscles that expand and contract to control the anal opening.   External hemorrhoid tissuelies under the anal skin.   The anusis the passage between the rectum and the outside of the body.  Normal hemorrhoid tissue  Hemorrhoid tissues play an important role in helping your body eliminate waste. Food passes from the stomach through the intestines. The waste (stool) then travels through the colon to the rectum. It is stored in the rectum until it's ready to be passed from the anus. During bowel movements, hemorrhoids swell with blood and become slightly larger. This swelling helps protect and cushion the anal canal as stool passes from the body. Once the stool has passed, the tissues stop swelling and return to normal.  Problem hemorrhoids  Pressure due to straining or other factors can cause hemorrhoid tissues to remain swollen. When this happens to the hemorrhoid tissues in the anal canal they're called internal hemorrhoids. Swollen tissues around the anal opening are called external hemorrhoids. Depending on the location, your symptoms can differ.   Internal hemorrhoidsoften happen in clusters around the wall of the anal canal. They are usually painless. But they may prolapse (protrude out of the anus) due to straining or pressure from hard stool. After the bowel movement is over, they may then reduce (return inside the body). Internal hemorrhoids  often bleed. They can also discharge mucus.     External hemorrhoidsare located at the anal opening, just beneath the skin. These tissues rarely cause problems unless they thrombose (form a blood clot). When this happens, a hard, bluish lump may appear. A thrombosed hemorrhoid also causes sudden, severe pain. In time, the clot may go away on its own. This sometimes leaves a "skin tag" of tissue stretched by the clot.  Hemorrhoid symptoms  Hemorrhoid symptoms may include:   Pain or a burning sensation   Bleeding during bowel movements   Protrusion of tissue from the anus   Itching around the anus  Causes of hemorrhoids  There's no single cause of hemorrhoids. Most often, though, they are caused by too much pressure on the anal canal. This can be due to:   Chronic (ongoing) constipation   Straining during bowel movements   Sitting too long on the toilet   Strenuous exercise or heavy lifting   Pregnancy and childbirth   Aging   Diarrhea  Date Last Reviewed: 01/04/2015   2000-2016 [employer] [address], [place] [address]. All rights reserved. This information is not intended as a substitute for professional medical care. Always follow your healthcare professional's instructions.        Treating Hemorrhoids: Self-Care    Follow your healthcare provider's advice about caring for your hemorrhoids at home. Some treatments help relieve symptoms right away. Others involve making changes in your diet and exercise habits. These can help ease constipation and prevent hemorrhoid symptoms from coming back.  Relieving symptoms  Your healthcare   provider may prescribe anti-inflammatory medicine to help ease your symptoms. The following tips will also help relieve pain and swelling.   Take sitz baths.Taking a sitz bath means sitting in a few inches of warm bath water. Soaking for 10 minutes twice a day can provide welcome relief from painful hemorrhoids. It can also help the area stay  clean.   Develop good bowel habits.Use the bathroom when you need to. Don't ignore the urge to move your bowels. This can lead to constipation, hard stools, and straining. Also, don't read while on the toilet. Sit only as long as needed. Wipe gently with soft, unscented toilet tissue or baby wipes.   Use ice packs.Placing an ice pack on a thrombosed external hemorrhoid can help relieve pain right away. It will also help reduce the blood clot. Use the ice for 15 to 20 minutes at a time. Keep a cloth between the ice and your skin to prevent skin damage.   Use other measures.Laxatives and enemas can help ease constipation. But use them only on your healthcare provider's advice. For symptom relief, try using cotton pads soaked in witch hazel. These are available at most drugstores. Over-the-counter hemorrhoid ointments and petroleum jelly can also provide relief.  Add fiber to your diet  Adding fiber to your diet can help relieve constipation by making stools softer and easier to pass. To increase your fiber intake, your healthcare provider may recommend a bulking agent, such as psyllium. This is a high-fiber supplement available at most grocery stores and drugstores. Eating more fiber-rich foods will also help. There are two types of fiber:   Insoluble fiberis the main ingredient in bulking agents. It's also found in foods such as wheat bran, whole-grain breads, fresh fruits, and vegetables.   Soluble fiberis found in foods such as oat bran. Although soluble fiber is good for you, it may not ease constipation as much as foods high in insoluble fiber.  Drink more water  Along with a high-fiber diet, drinking more water can help ease constipation. This is because insoluble fiber absorbs water, making stools soft and bulky. Be sure to drink plenty of water throughout the day. Drinking fruit juices, such as prune juice or apple juice, can also help prevent constipation.  Get more exercise  Regular exercise aids  digestion and helps prevent constipation. It's also great for your health. So talk with your healthcare provider about starting an exercise program. Low-impact activities, such as swimming or walking, are good places to start. Take it easy at first. And remember to drink plenty of water when you exercise.  High-fiber foods  High-fiber foods offer many benefits. By making your stools softer, they help heal and prevent swollen hemorrhoids. They may also help reduce the risk of colon and rectal cancer. Best of all, they're usually low in calories and taste great. Here are some examples of fiber-rich foods.   Whole grains,such as wheat bran, corn bran, and brown rice.   Vegetables,especially carrots, broccoli, cabbage, and peas.   Fruits,such as apples, bananas, raisins, peaches, and pears.   Nuts and legumes,especially peanuts, lentils, and kidney beans.  Easy ways to add fiber  The tips below offer some simple ways to add more high-fiber foods to your meals.   Start your day with a high-fiber breakfast. Eat a wheat bran cereal along with a sliced banana. Or, try peanut butter on whole-wheat toast.   Eat carrot sticks for snacks. They're easy to prepare, taste great, and are low in   calories.   Use whole-grain breads instead of white bread for sandwiches.   Eat fruits for treats. Try an apple and some raisins instead of a candy bar.   Date Last Reviewed: 01/04/2015   2000-2016 [employer]. [address], [place] [address]. All rights reserved. This information is not intended as a substitute for professional medical care. Always follow your healthcare professional's instructions.        Understanding Colon and Rectal Polyps    The colon (also called the large intestine) is a muscular tube that forms the last part of the digestive tract. It absorbs water and stores food waste. The colon is about 4 to 6 feet long. The rectum is the last 6 inches of the colon. The colon and rectum have a  smooth lining composed of millions of cells. Changes in these cells can lead to growths in the colon that can become cancerous and should be removed. Multiple tests are available to screen for colon cancer, but the colonoscopy is the most recommended test. During colonoscopy, these polyps can be removed. How often you need this test depends on many things including your condition, your family history, symptoms, and what the findings were at the previous colonoscopy.  When the colon lining changes  Changes that happen in the cells that line the colon or rectum can lead to growths called polyps. Over a period of years, polyps can turn cancerous. Removing polyps early may prevent cancer from ever forming.  Polyps  Polyps are fleshy clumps of tissue that form on the lining of the colon or rectum. Small polyps are usually benign (not cancerous). However, over time, cells in a polyp can change and become cancerous. Certain types of polyps known as adenomatous polyps are premalignant. The risk for invasive cancer increases with the size of the polyp and certain cell and gene features. This means that they can become cancerous if they're not removed.Hyperplastic polyps are benign. They can grow quite large and not turn cancerous.  Cancer  Almost all colorectal cancers start when polyp cells begin growing abnormally. As a cancerous tumor grows, it may involve more and more of the colon or rectum. In time, cancer can also grow beyond the colon or rectum and spread to nearby organs or to glands called lymph nodes. The cells can also travel to other parts of the body. This is known as metastasis. The earlier a cancerous tumor is removed, the better the chance of preventing its spread.    Date Last Reviewed: 02/04/2015   2000-2016 [employer]. [address], [place] [address]. All rights reserved. This information is not intended as a substitute for professional medical care. Always follow your  healthcare professional's instructions.

## 2016-02-14 ENCOUNTER — Encounter: Payer: Self-pay | Admitting: Gastroenterology

## 2016-02-14 LAB — LAB USE ONLY - HISTORICAL SURGICAL PATHOLOGY

## 2016-06-05 ENCOUNTER — Other Ambulatory Visit: Payer: Self-pay | Admitting: Neurology

## 2016-10-04 HISTORY — PX: EUS (ENDOSCOPIC ULTRASOUND): SHX3911

## 2017-01-17 ENCOUNTER — Emergency Department: Payer: BLUE CROSS/BLUE SHIELD

## 2017-01-17 ENCOUNTER — Emergency Department
Admission: EM | Admit: 2017-01-17 | Discharge: 2017-01-17 | Disposition: A | Discharge status: Home / Self Care | Payer: BLUE CROSS/BLUE SHIELD | Attending: Emergency Medicine | Admitting: Emergency Medicine

## 2017-01-17 DIAGNOSIS — Z79899 Other long term (current) drug therapy: Secondary | ICD-10-CM | POA: Insufficient documentation

## 2017-01-17 DIAGNOSIS — Z7982 Long term (current) use of aspirin: Secondary | ICD-10-CM | POA: Insufficient documentation

## 2017-01-17 DIAGNOSIS — M5442 Lumbago with sciatica, left side: Secondary | ICD-10-CM | POA: Insufficient documentation

## 2017-01-17 DIAGNOSIS — I10 Essential (primary) hypertension: Secondary | ICD-10-CM | POA: Insufficient documentation

## 2017-01-17 DIAGNOSIS — K219 Gastro-esophageal reflux disease without esophagitis: Secondary | ICD-10-CM | POA: Insufficient documentation

## 2017-01-17 DIAGNOSIS — M5441 Lumbago with sciatica, right side: Secondary | ICD-10-CM | POA: Insufficient documentation

## 2017-01-17 DIAGNOSIS — N400 Enlarged prostate without lower urinary tract symptoms: Secondary | ICD-10-CM | POA: Insufficient documentation

## 2017-01-17 DIAGNOSIS — E785 Hyperlipidemia, unspecified: Secondary | ICD-10-CM | POA: Insufficient documentation

## 2017-01-17 DIAGNOSIS — G35 Multiple sclerosis: Secondary | ICD-10-CM | POA: Insufficient documentation

## 2017-01-17 MED ORDER — PREDNISONE 20 MG PO TABS
40.0000 mg | ORAL_TABLET | Freq: Every day | ORAL | 0 refills | Status: AC
Start: 2017-01-17 — End: 2017-01-22

## 2017-01-17 MED ORDER — PREDNISONE 20 MG PO TABS
60.0000 mg | ORAL_TABLET | Freq: Once | ORAL | Status: AC
Start: 2017-01-17 — End: 2017-01-17
  Administered 2017-01-17: 60 mg via ORAL
  Filled 2017-01-17: qty 3

## 2017-01-17 MED ORDER — OXYCODONE-ACETAMINOPHEN 5-325 MG PO TABS
2.0000 | ORAL_TABLET | Freq: Once | ORAL | Status: AC
Start: 2017-01-17 — End: 2017-01-17
  Administered 2017-01-17: 2 via ORAL
  Filled 2017-01-17: qty 2

## 2017-01-17 MED ORDER — OXYCODONE-ACETAMINOPHEN 5-325 MG PO TABS
1.0000 | ORAL_TABLET | ORAL | 0 refills | Status: DC | PRN
Start: 2017-01-17 — End: 2018-04-22

## 2017-01-17 MED ORDER — ONDANSETRON 4 MG PO TBDP
4.0000 mg | ORAL_TABLET | Freq: Once | ORAL | Status: AC
Start: 2017-01-17 — End: 2017-01-17
  Administered 2017-01-17: 4 mg via ORAL
  Filled 2017-01-17: qty 1

## 2017-01-17 NOTE — ED Triage Notes (Signed)
Pt c/o mid lower back since Friday AM. Increased pain since last night. +shooting pains down bilateral legs. Pt denies injury/trauma.

## 2017-01-17 NOTE — ED Provider Notes (Signed)
[facility] APP H&P      Visit date: 01/17/2017      CLINICAL SUMMARY          Diagnosis:    .     Final diagnoses:   Acute bilateral low back pain with bilateral sciatica         MDM Notes:      Pt comes to the ER with lower back pain, b/l siatica.    Plan: MRI, pain management, f/u ortho      Disposition:          ED Disposition     ED Disposition Condition Date/Time Comment    Discharge  Sun Jan 17, 2017 10:01 AM Reine Just discharge to home/self care.    Condition at disposition: Stable                     CLINICAL INFORMATION        HPI:      Chief Complaint: Back Pain  .    [name] Young is a 66 y.o. male who presents with back pain.  Pt states he bent over Thursday night to put a dish in the dishwasher and had sudden severe back pain, since then the pain has become much worse and he has pain shooting down the back of both legs.  Pt states he is now unable to walk.  Pt denies numbness of LE, has numbness of UE but he states he had that before due to his MS.       Context: Home  Location: Lower back  Duration: 3 days  Quality: Ache, sharp  Timing: Constant  Severity: Severe  Modifying factors: None  Associated Symptoms: Sciatica    History obtained from: Patient          ROS:      Constitutional: Negative for fever, chills, fatigue or change in appetite.   Musculoskeletal: Denies trauma or fall. +back pain w/sciatica  Skin: Negative for color change or rash.   Neurological: Negative for dizziness or lightheadedness. Negative for syncope.   Hematological: Does not bruise/bleed easily. Does not take blood thinners.   Psychiatric/Behavioral: Negative for confusion and agitation. Denies memory loss.  All other systems reviewed and are negative      Physical Exam:      Pulse 68  BP 160/90  Resp 16  SpO2 97 %  Temp 97.4 F (36.3 C)  Constitutional: Vital signs reviewed. Well appearing. Nontoxic. Non distressed. Not diaphoretic.   Head: Normocephalic,  atraumatic.  Neck: Supple. Normal range of motion.   Musculoskeletal:  No edema or cyanosis Bilat. +PMS. 5/5 strength BLE, no saddle anaesthesia  Neurological: No focal motor deficits by observation. GCS 15. Speech normal. Memory normal.  Skin: Warm and dry. No rashes noted. No obvious signs of trauma.   Psychiatric: Awake and alert. Normal affect and normal concentration for age.  Good insight, good historian.            PAST HISTORY        Primary Care Provider: Judene Companion, MD        PMH/PSH:    .     Past Medical History:   Diagnosis Date   . Arthritis    . Bilateral cataracts 12/05/2010    Surgery on both eyes   . Chronic gout    . Complication of anesthesia 08/012009    Takes longer than usual for me to recover   .  Diverticulosis of colon without diverticulitis    . GERD (gastroesophageal reflux disease)    . Headache     Occasional   . Heart murmur 02/03/1954    Had murmur since I was a child   . Herpes simplex without mention of complication    . Hiatal hernia    . Hyperlipidemia    . Hypertension     controlled   . Irritable bowel syndrome 02/04/1999   . Multiple sclerosis    . Pancreatitis 10/05/2014    Creon   . Prostate enlargement    . Reflux        He has a past surgical history that includes Tonsillectomy (1954); Colonoscopy (2015); Eye surgery (Bilateral, 2012); EUS (ENDOSCOPIC ULTRASOUND) (2016); and Colonoscopy (N/A, 02/13/2016).      Social/Family History:      He reports that he has never smoked. He has never used smokeless tobacco. He reports that he does not drink alcohol or use drugs.    Family History   Problem Relation Age of Onset   . Cancer Mother    . Arrhythmia Mother    . Atrial fibrillation Mother    . Hypertension Mother    . Heart disease Father    . Myocardial Infarction Father 35   . Dementia Father    . Cancer Father    . Hyperlipidemia Father    . Diabetes Father          Listed Medications on Arrival:    .     Home Medications     Med List Status:  In Progress Set By:  Hortencia Pilar, RN at 01/17/2017  6:12 AM                acyclovir (ZOVIRAX) 200 MG capsule     Take 200 mg by mouth every other day.         aspirin 81 MG tablet     Take 81 mg by mouth daily.       B Complex Vitamins (VITAMIN B COMPLEX IJ)          Cholecalciferol ([employer] VIT D 5000 HIGH-POTENCY) 5000 UNITS capsule     Take 7,000 Units by mouth daily.        esomeprazole (NEXIUM) 40 MG capsule     Take by mouth daily.         Febuxostat (ULORIC) 40 MG tablet     Take 40 mg by mouth daily.       gemfibrozil (LOPID) 600 MG tablet     Take 600 mg by mouth 2 (two) times daily before meals.       GILENYA 0.5 MG Cap     Take 0.5 mg by mouth daily.        Glucosamine Sulfate 750 MG CAPS     Take 500 mg by mouth 2 (two) times daily.        lisinopril (PRINIVIL,ZESTRIL) 10 MG tablet     Take 10 mg by mouth daily.       Multiple Vitamin (MULTIVITAMIN) tablet     Take 1 tablet by mouth daily.       pancrelipase, Lip-Prot-Amyl, (CREON) 24000 units Cap DR Particles          Probiotic Product ([facility] COLON HEALTH PO)     Take by mouth every evening.     zolpidem (AMBIEN) 10 MG tablet     Take 10 mg by mouth nightly as needed.  Allergies: He is allergic to erythromycin and allopurinol.            VISIT INFORMATION        Clinical Course in the ED:            Medications Given in the ED:    .     ED Medication Orders     Start Ordered     Status Ordering Provider    01/17/17 1002 01/17/17 1001  predniSONE (DELTASONE) tablet 60 mg  Once     Route: Oral  Ordered Dose: 60 mg     Last MAR action:  Given WU, JULIA LEE    01/17/17 0653 01/17/17 0652  ondansetron (ZOFRAN-ODT) disintegrating tablet 4 mg  Once     Route: Oral  Ordered Dose: 4 mg     Last MAR action:  Given Arvon Schreiner L    01/17/17 0652 01/17/17 0652  oxyCODONE-acetaminophen (PERCOCET) 5-325 MG per tablet 2 tablet  Once     Route: Oral  Ordered Dose: 2 tablet     Last MAR action:  Given Kathi Dohn L            Procedures:      Procedures       Interpretations:      O2 sat-           saturation: 97 %; Oxygen use: room air; Interpretation: Normal    DDx-              Initial differential diagnosis included: Lumbago w/sciatica, herniated disc, muscle spasm               RESULTS        Lab Results:      Results     ** No results found for the last 24 hours. **              Radiology Results:      MRI L-spine without Contrast   Final Result      1. No edema in the intervertebral discs, bones, facet joints, or soft   tissues is seen in the lumbar spine.       2. There are gentle disc bulges at each level of the lumbar spine. The   small disc bulges extend into the neural foramen in subtle fashion   bilaterally both at L4-L5 and bilaterally at L5-S1.      3. There is mild degenerative change of the facet joint bilaterally at   each level of the lumbar spine.      4. No central spinal canal stenosis, lumbar disc herniation or specific   nerve root impingement is seen in the lumbar spine.       5. There has been no change since the MRI of December 30, 2013.      Theodoro Doing, MD    01/17/2017 9:32 AM                  Scribe Attestation:      No scribe involved in the care of this patient                            Bonna Gains, [place]  01/20/17 2159

## 2017-01-17 NOTE — ED Provider Notes (Signed)
This patient has been signed out to me by Courtney Paris, PA-C.  Awaiting L-spine MRI.    L-spine MRI does not show any acute findings. Unchanged from previous MRI.  Pt states pain down legs is now improved with PO Percocet.  Rx Percocet and Prednisone given for sciatic pain.  Pt to f/u with Dr. Allena Katz (spine) for further management.     Lucita Ferrara, PA  01/17/17 1138       Clovis Riley, MD  01/17/17 [phone]

## 2017-01-17 NOTE — Discharge Instructions (Signed)
Back Pain, Lumbar NOS    You have been seen for low back pain.     This area is also called the lumbar spine.    Pain in the low back is very a common problem. This pain is usually due to overuse of the muscles, tension or a strain of the muscles. There can also be damage to the discs that cushion the bones in the spine. This can cause irritation of the nerves that exit the spinal canal in the low back.    Your doctor did not find any pain over the bones in your back (even though you might have pain in the back muscles). This means it is very unlikely that you have a broken bone in your back. Your doctor did not think it was necessary to take an x-ray.    The doctor still does not know the exact cause of your pain. Your problem does not seem to be from a dangerous cause. It is OK for you to go home today.    Some things you can try to help your back feel better are:   Apply a warm damp washcloth to the back for 20 minutes at a time, at least 4 times per day. This will reduce your pain. Massaging your back might also help.   Have someone massage the sore parts of your back.   Don't do any heavy lifting or bending. You can go back to normal daily activities if they don't make the pain worse.   Use the over-the-counter anti-inflammatory medication ibuprofen (also known as Advil or Motrin) as directed on the package to help with pain and inflammation.    It is normal for the pain to last for the next few days. If your pain gets better, you probably do not need to see a doctor. However, if your symptoms get worse or you have new symptoms, you should return here or go to the nearest Emergency Department.    Call your doctor or go to the nearest Emergency Department if you your pain does not improve or your pain is bad enough to seriously limit your normal activities.    YOU SHOULD SEEK MEDICAL ATTENTION IMMEDIATELY, EITHER HERE OR AT THE NEAREST EMERGENCY DEPARTMENT, IF ANY OF THE FOLLOWING OCCURS:   You  think the pain is coming from somewhere other than your back. This can include pelvic pain. This can be from infections in the pelvis or lower belly.   You have abdominal (belly) pain that goes through to your back.   Your legs tingle or get numb (lose feeling).   Your legs are weak.   You have fever (temperature higher than 100.4F / 38C) along with back pain.   Your back pain is getting worse.   You lose control of your bladder or bowels. If this were to happen, it may cause you to wet or soil yourself.   You have problems urinating (peeing).                   Sciatica    You have been diagnosed with sciatica.    Sciatica is a general term for pain in the low back, hip, or buttocks that travels down the leg. The pain rarely goes below the knees. The pain can be severe and can last for a few days up to a few weeks. A common cause of this type of pain is a pinched nerve from a slipped disk. Sometimes the cause of the   Treatment includes rest and pain medications. Steroids may be helpful, especially if the cause is a pinched nerve.    YOU SHOULD SEEK MEDICAL ATTENTION IMMEDIATELY, EITHER HERE OR AT THE NEAREST EMERGENCY DEPARTMENT, IF ANY OF THE FOLLOWING OCCURS:   You develop numbness (loss of feeling) or tingling (pins and needles) in your legs.   You develop weakness in your legs that keeps you from standing or walking or makes you stumble or trip over your own feet.   You have trouble controlling your bowels or bladder (you soil or wet yourself).   (MEN) You are unable to have an erection.   Your symptoms become worse.   You have fever (temperature higher than 100.54F / 38C) or shaking chills, especially with an increase in back pain.             Thank you for choosing [facility] for your emergency care needs.  We strive to provide EXCELLENT care to you and your family.      If you do not continue to improve or your condition worsens, please contact your  doctor or return immediately to the Emergency Department.    ONSITE PHARMACY  Our full service onsite pharmacy is a 2 minute walk from the ER.  Open Mon to Fri from 8 am to 8 pm, Sat and Sun 9 am to 5 pm. Ask an ED staff member for directions.  We accept all major insurances and prices are competitive with major retailers.  Ask your provider to print your prescriptions down to the pharmacy to speed you on your way home.      DOCTOR REFERRALS  Call [phone] (available 24 hours a day, 7 days a week) if you need any further referrals and we can help you find a primary care doctor or specialist.  Also, available online at:  [email or web address]    YOUR CONTACT INFORMATION  Before leaving please check with registration to make sure we have an up-to-date contact number.  You can call registration at [phone] to update your information.  For questions about your hospital bill, please call [phone].  For questions about your Emergency Dept Physician bill please call [phone].      FREE HEALTH SERVICES  If you need help with health or social services, please call 2-1-1 for a free referral to resources in your area.  2-1-1 is a free service connecting people with information on health insurance, free clinics, pregnancy, mental health, dental care, food assistance, housing, and substance abuse counseling.  Also, available online at:  http://www.[facility].org    MEDICAL RECORDS AND TESTS  Certain laboratory test results do not come back the same day, for example urine cultures.   We will contact you if other important findings are noted.  Radiology films are often reviewed again to ensure accuracy.  If there is any discrepancy, we will notify you.      Please call [phone] to pick up a complimentary CD of any radiology studies performed.  If you or your doctor would like to request a copy of your medical records, please call [phone].          ORTHOPEDIC INJURY   Please  know that significant injuries can exist even when an initial x-ray is read as normal or negative.  This can occur because some fractures (broken bones) are not initially visible on x-rays.  For this reason, close outpatient follow-up with your primary care doctor or bone specialist (  orthopedist) is required.    MEDICATIONS AND FOLLOWUP  Please be aware that some prescription medications can cause drowsiness.  Use caution when driving or operating machinery.    The examination and treatment you have received in our Emergency Department is provided on an emergency basis, and is not intended to be a substitute for your primary care physician.  It is important that your doctor checks you again and that you report any new or remaining problems at that time.      24 HOUR PHARMACIES  [facility] - [address] [place], [place] [address] (1.4 miles, 7 minutes)  Handout with directions available on request      ASSISTANCE WITH INSURANCE    Affordable Care Act  [facility])  Call to start or finish an application, compare plans, enroll or ask a question.  [phone]  TTY: [phone]  Web:  Healthcare.gov    Help Enrolling in [facility]  [employer] [place]  530-369-8278 (TOLL-FREE)  ([phone] (TTY)  Web:  Http://www.coverva.org    Local Help Enrolling in the [facility]  [place] [facility]  [phone] (MAIN)  Email:  health-help@nvfs .org  Web:  [employer]jack[employer]ths.is  Address:  [address], [address] [place], [place] [address]

## 2017-03-12 ENCOUNTER — Telehealth: Payer: BLUE CROSS/BLUE SHIELD

## 2017-03-12 NOTE — Pre-Procedure Instructions (Signed)
   Pt states no testing or evals were requested by surgeon for procedure   No testing required per guidelines.    Pt will call [phone] with ASA plan.    MAP/CHG emailed to [email or web address]

## 2017-03-15 ENCOUNTER — Ambulatory Visit: Payer: BLUE CROSS/BLUE SHIELD | Attending: Urology

## 2017-03-15 DIAGNOSIS — N4340 Spermatocele of epididymis, unspecified: Secondary | ICD-10-CM | POA: Insufficient documentation

## 2017-03-15 DIAGNOSIS — Z01818 Encounter for other preprocedural examination: Secondary | ICD-10-CM | POA: Insufficient documentation

## 2017-03-15 LAB — BASIC METABOLIC PANEL
BUN: 19 mg/dL (ref 9.0–28.0)
CO2: 22 mEq/L (ref 21–29)
Calcium: 9.7 mg/dL (ref 8.5–10.5)
Chloride: 105 mEq/L (ref 100–111)
Creatinine: 1.1 mg/dL (ref 0.5–1.5)
Glucose: 113 mg/dL — ABNORMAL HIGH (ref 70–100)
Potassium: 4.3 mEq/L (ref 3.5–5.1)
Sodium: 137 mEq/L (ref 136–145)

## 2017-03-15 LAB — CBC
Absolute NRBC: 0 10*3/uL
Hematocrit: 39.3 % — ABNORMAL LOW (ref 42.0–52.0)
Hgb: 12.8 g/dL — ABNORMAL LOW (ref 13.0–17.0)
MCH: 28.5 pg (ref 28.0–32.0)
MCHC: 32.6 g/dL (ref 32.0–36.0)
MCV: 87.5 fL (ref 80.0–100.0)
MPV: 10 fL (ref 9.4–12.3)
Nucleated RBC: 0 /100 WBC (ref 0.0–1.0)
Platelets: 250 10*3/uL (ref 140–400)
RBC: 4.49 10*6/uL — ABNORMAL LOW (ref 4.70–6.00)
RDW: 14 % (ref 12–15)
WBC: 3.74 10*3/uL (ref 3.50–10.80)

## 2017-03-15 LAB — HEMOLYSIS INDEX: Hemolysis Index: 9 (ref 0–18)

## 2017-03-15 LAB — ECG 12-LEAD
Atrial Rate: 62 {beats}/min
P Axis: 48 degrees
P-R Interval: 188 ms
Q-T Interval: 404 ms
QRS Duration: 78 ms
QTC Calculation (Bezet): 410 ms
R Axis: 8 degrees
T Axis: 14 degrees
Ventricular Rate: 62 {beats}/min

## 2017-03-15 LAB — GFR: EGFR: >60

## 2017-03-16 NOTE — Pre-Procedure Instructions (Addendum)
Pt left VM [id number] stating he was instructed by his Surgeon to hold his ASA for 7-10 days prior to surgery. Pt states he will hold it for 8 days prior. VM left to pt that we got his message.  Documentation added to chart and TOR team emailed as FYI 03/16/2017.

## 2017-04-01 NOTE — Anesthesia Preprocedure Evaluation (Addendum)
Anesthesia Evaluation    AIRWAY    Mallampati: III      Mouth Opening:full   CARDIOVASCULAR    cardiovascular exam normal and normal       DENTAL    no notable dental hx       PULMONARY    clear to auscultation     OTHER FINDINGS    HTN (Lisinopril 09/27)  Multiple Sclerosis (last exacerbation 21 years ago)  GERD/HH (controlled with meds)  Gout          Relevant Problems   (+) HTN (hypertension)               Anesthesia Plan    ASA 3     general                     intravenous induction   Detailed anesthesia plan: general LMA  Monitors/Adjuncts: other    Post Op: other  Post op pain management: per surgeon    informed consent obtained    Plan discussed with CRNA.                   Signed by: Elmon Kirschner 04/01/17 6:19 PM

## 2017-04-02 ENCOUNTER — Ambulatory Visit: Payer: Self-pay

## 2017-04-02 ENCOUNTER — Other Ambulatory Visit: Payer: Self-pay

## 2017-04-02 ENCOUNTER — Ambulatory Visit
Admission: RE | Admit: 2017-04-02 | Discharge: 2017-04-02 | Disposition: A | Discharge status: Home / Self Care | Payer: BLUE CROSS/BLUE SHIELD | Source: Ambulatory Visit | Attending: Urology | Admitting: Urology

## 2017-04-02 ENCOUNTER — Ambulatory Visit: Payer: BLUE CROSS/BLUE SHIELD | Admitting: Anesthesiology

## 2017-04-02 ENCOUNTER — Encounter
Admission: RE | Disposition: A | Discharge status: Home / Self Care | Payer: Self-pay | Source: Ambulatory Visit | Attending: Urology

## 2017-04-02 DIAGNOSIS — N4340 Spermatocele of epididymis, unspecified: Secondary | ICD-10-CM | POA: Diagnosis present

## 2017-04-02 DIAGNOSIS — K219 Gastro-esophageal reflux disease without esophagitis: Secondary | ICD-10-CM | POA: Insufficient documentation

## 2017-04-02 DIAGNOSIS — G35 Multiple sclerosis: Secondary | ICD-10-CM | POA: Insufficient documentation

## 2017-04-02 DIAGNOSIS — E785 Hyperlipidemia, unspecified: Secondary | ICD-10-CM | POA: Insufficient documentation

## 2017-04-02 DIAGNOSIS — I10 Essential (primary) hypertension: Secondary | ICD-10-CM | POA: Insufficient documentation

## 2017-04-02 DIAGNOSIS — K449 Diaphragmatic hernia without obstruction or gangrene: Secondary | ICD-10-CM | POA: Insufficient documentation

## 2017-04-02 DIAGNOSIS — N4341 Spermatocele of epididymis, single: Secondary | ICD-10-CM | POA: Insufficient documentation

## 2017-04-02 HISTORY — DX: Low back pain, unspecified: M54.50

## 2017-04-02 HISTORY — PX: SPERMATOCELECTOMY: SHX2420

## 2017-04-02 HISTORY — DX: Disorder of prostate, unspecified: N42.9

## 2017-04-02 SURGERY — SPERMATOCELECTOMY
Anesthesia: Anesthesia General | Site: Scrotum | Laterality: Left | Wound class: Clean

## 2017-04-02 MED ORDER — PROPOFOL 10 MG/ML IV EMUL (WRAP)
INTRAVENOUS | Status: DC | PRN
Start: 2017-04-02 — End: 2017-04-02
  Administered 2017-04-02: 200 mg via INTRAVENOUS

## 2017-04-02 MED ORDER — ONDANSETRON HCL 4 MG/2ML IJ SOLN
INTRAMUSCULAR | Status: AC
Start: 2017-04-02 — End: ?
  Filled 2017-04-02: qty 2

## 2017-04-02 MED ORDER — MIDAZOLAM HCL 2 MG/2ML IJ SOLN
INTRAMUSCULAR | Status: AC
Start: 2017-04-02 — End: ?
  Filled 2017-04-02: qty 2

## 2017-04-02 MED ORDER — ACETAMINOPHEN 325 MG PO TABS
650.0000 mg | ORAL_TABLET | Freq: Once | ORAL | Status: AC | PRN
Start: 2017-04-02 — End: 2017-04-02

## 2017-04-02 MED ORDER — DEXAMETHASONE SODIUM PHOSPHATE 4 MG/ML IJ SOLN (WRAP)
INTRAMUSCULAR | Status: DC | PRN
Start: 2017-04-02 — End: 2017-04-02
  Administered 2017-04-02: 4 mg via INTRAVENOUS

## 2017-04-02 MED ORDER — CEFAZOLIN SODIUM-DEXTROSE 2-3 GM-% IV SOLR
2.0000 g | INTRAVENOUS | Status: AC
Start: 2017-04-02 — End: 2017-04-02
  Administered 2017-04-02: 08:00:00 2 g via INTRAVENOUS
  Filled 2017-04-02: qty 50

## 2017-04-02 MED ORDER — OXYCODONE-ACETAMINOPHEN 5-325 MG PO TABS
1.0000 | ORAL_TABLET | Freq: Four times a day (QID) | ORAL | 0 refills | Status: DC | PRN
Start: 2017-04-02 — End: 2018-04-22
  Filled 2017-04-02: qty 30, 8d supply, fill #0

## 2017-04-02 MED ORDER — LIDOCAINE HCL 2 % IJ SOLN
INTRAMUSCULAR | Status: DC | PRN
Start: 2017-04-02 — End: 2017-04-02
  Administered 2017-04-02: 80 mg via INTRAVENOUS

## 2017-04-02 MED ORDER — ONDANSETRON HCL 4 MG/2ML IJ SOLN
4.0000 mg | Freq: Once | INTRAMUSCULAR | Status: DC | PRN
Start: 2017-04-02 — End: 2017-04-02

## 2017-04-02 MED ORDER — PHENYLEPHRINE 100 MCG/ML IN NACL 0.9% IV SOSY
PREFILLED_SYRINGE | INTRAVENOUS | Status: AC
Start: 2017-04-02 — End: ?
  Filled 2017-04-02: qty 5

## 2017-04-02 MED ORDER — HYDROMORPHONE HCL 0.5 MG/0.5 ML IJ SOLN
0.5000 mg | INTRAMUSCULAR | Status: DC | PRN
Start: 2017-04-02 — End: 2017-04-02

## 2017-04-02 MED ORDER — LACTATED RINGERS IV SOLN
INTRAVENOUS | Status: DC
Start: 2017-04-02 — End: 2017-04-02

## 2017-04-02 MED ORDER — SODIUM CHLORIDE 0.9% BAG (IRRIGATION USE)
INTRAVENOUS | Status: DC | PRN
Start: 2017-04-02 — End: 2017-04-02
  Administered 2017-04-02: 1000 mL

## 2017-04-02 MED ORDER — ONDANSETRON HCL 4 MG/2ML IJ SOLN
INTRAMUSCULAR | Status: DC | PRN
Start: 2017-04-02 — End: 2017-04-02
  Administered 2017-04-02: 4 mg via INTRAVENOUS

## 2017-04-02 MED ORDER — PROPOFOL 10 MG/ML IV EMUL (WRAP)
INTRAVENOUS | Status: AC
Start: 2017-04-02 — End: ?
  Filled 2017-04-02: qty 20

## 2017-04-02 MED ORDER — DEXAMETHASONE SODIUM PHOSPHATE 20 MG/5ML IJ SOLN
INTRAMUSCULAR | Status: AC
Start: 2017-04-02 — End: ?
  Filled 2017-04-02: qty 5

## 2017-04-02 MED ORDER — LIDOCAINE HCL (PF) 2 % IJ SOLN
INTRAMUSCULAR | Status: AC
Start: 2017-04-02 — End: ?
  Filled 2017-04-02: qty 5

## 2017-04-02 MED ORDER — GLYCOPYRROLATE 0.2 MG/ML IJ SOLN
INTRAMUSCULAR | Status: AC
Start: 2017-04-02 — End: ?
  Filled 2017-04-02: qty 1

## 2017-04-02 MED ORDER — PHENYLEPHRINE HCL 10 MG/ML IV SOLN (WRAP)
Status: DC | PRN
Start: 2017-04-02 — End: 2017-04-02
  Administered 2017-04-02: 50 ug via INTRAVENOUS
  Administered 2017-04-02: 100 ug via INTRAVENOUS
  Administered 2017-04-02: 50 ug via INTRAVENOUS

## 2017-04-02 MED ORDER — OXYCODONE HCL 5 MG PO TABS
ORAL_TABLET | ORAL | Status: AC
Start: 2017-04-02 — End: 2017-04-02
  Administered 2017-04-02: 10:00:00 5 mg via ORAL
  Filled 2017-04-02: qty 1

## 2017-04-02 MED ORDER — PROPOFOL 10 MG/ML IV EMUL (WRAP)
INTRAVENOUS | Status: AC
Start: 2017-04-02 — End: ?
  Filled 2017-04-02: qty 50

## 2017-04-02 MED ORDER — PROPOFOL INFUSION 10 MG/ML
INTRAVENOUS | Status: DC | PRN
Start: 2017-04-02 — End: 2017-04-02
  Administered 2017-04-02: 50 ug/kg/min via INTRAVENOUS

## 2017-04-02 MED ORDER — LACTATED RINGERS IV SOLN
INTRAVENOUS | Status: DC
Start: 2017-04-02 — End: 2017-04-02
  Administered 2017-04-02: 1000 mL via INTRAVENOUS

## 2017-04-02 MED ORDER — FENTANYL CITRATE (PF) 50 MCG/ML IJ SOLN (WRAP)
INTRAMUSCULAR | Status: DC | PRN
Start: 2017-04-02 — End: 2017-04-02
  Administered 2017-04-02 (×2): 50 ug via INTRAVENOUS

## 2017-04-02 MED ORDER — ACETAMINOPHEN 325 MG PO TABS
ORAL_TABLET | ORAL | Status: AC
Start: 2017-04-02 — End: 2017-04-02
  Administered 2017-04-02: 10:00:00 650 mg via ORAL
  Filled 2017-04-02: qty 2

## 2017-04-02 MED ORDER — KETOROLAC TROMETHAMINE 60 MG/2ML IM SOLN
INTRAMUSCULAR | Status: AC
Start: 2017-04-02 — End: ?
  Filled 2017-04-02: qty 2

## 2017-04-02 MED ORDER — BUPIVACAINE HCL 0.25 % IJ SOLN
INTRAMUSCULAR | Status: DC | PRN
Start: 2017-04-02 — End: 2017-04-02
  Administered 2017-04-02: 7 mL

## 2017-04-02 MED ORDER — BACITRACIN ZINC 500 UNIT/GM EX OINT
TOPICAL_OINTMENT | CUTANEOUS | Status: AC
Start: 2017-04-02 — End: ?
  Filled 2017-04-02: qty 15

## 2017-04-02 MED ORDER — OXYCODONE HCL 5 MG PO TABS
5.0000 mg | ORAL_TABLET | Freq: Once | ORAL | Status: AC | PRN
Start: 2017-04-02 — End: 2017-04-02

## 2017-04-02 MED ORDER — CEFAZOLIN SODIUM-DEXTROSE 2-3 GM-% IV SOLR
INTRAVENOUS | Status: AC
Start: 2017-04-02 — End: 2017-04-02
  Filled 2017-04-02: qty 50

## 2017-04-02 MED ORDER — KETOROLAC TROMETHAMINE 30 MG/ML IJ SOLN
INTRAMUSCULAR | Status: DC | PRN
Start: 2017-04-02 — End: 2017-04-02
  Administered 2017-04-02: 15 mg via INTRAVENOUS

## 2017-04-02 MED ORDER — BUPIVACAINE HCL (PF) 0.25 % IJ SOLN
INTRAMUSCULAR | Status: AC
Start: 2017-04-02 — End: ?
  Filled 2017-04-02: qty 20

## 2017-04-02 MED ORDER — FAMOTIDINE 20 MG/2ML IV SOLN
INTRAVENOUS | Status: AC
Start: 2017-04-02 — End: ?
  Filled 2017-04-02: qty 2

## 2017-04-02 MED ORDER — FAMOTIDINE 10 MG/ML IV SOLN (WRAP)
INTRAVENOUS | Status: DC | PRN
Start: 2017-04-02 — End: 2017-04-02
  Administered 2017-04-02: 20 mg via INTRAVENOUS

## 2017-04-02 MED ORDER — FENTANYL CITRATE (PF) 50 MCG/ML IJ SOLN (WRAP)
25.0000 ug | INTRAMUSCULAR | Status: DC | PRN
Start: 2017-04-02 — End: 2017-04-02

## 2017-04-02 SURGICAL SUPPLY — 26 items
CNTNRW LID CLR 8OZ (Suction) ×1
CONTAINER SPEC 8OZ NS SNPON LID TRNLU (Suction) ×1 IMPLANT
DRAIN PENROSE 18X.5IN STRL (Drain) ×2 IMPLANT
GLOVE SRG DERMASHIELD NPRN 7 GAMMEX LF (Glove) ×1
GLOVE SRGCL 7 GMMX CHEMOTHERAPY PWDR FREE ANTISLIP SMTH MICRO NPRN GRN (Glove) ×1 IMPLANT
GLOVE SURG DERMAPRENE NL SZ7 (Glove) ×1
GLOVE SURGICAL 7 GAMMEX CHEMOTHERAPY (Glove) ×1
KIT MAJOR FFX (Kits) ×2 IMPLANT
SOLUTION BETADINE 4OZ (Scrub Supplies) ×2
SOLUTION SRGSCRB 10% PVP IOD 4OZ PLS BTL (Scrub Supplies) ×2 IMPLANT
SOLUTION SURGICAL SCRUB 10% PVP IODINE 4OZ PLASTIC BOTTLE AQUEOUS SKIN (Scrub Supplies) ×2 IMPLANT
SPONGE GAUZE L6 3/4 IN X W6 IN MEDIUM (Dressing) ×1 IMPLANT
SPONGE GAUZE L6 3/4 IN X W6 IN MEDIUM ABSORBENT FLUFF DRY CRINKLE (Dressing) ×1 IMPLANT
SPONGE GZE CTTN MED KRLX 6.75X6IN LF (Dressing) ×1
SPONGE PEANUT C5 HOLDER DISSECTOR XRAY (Sponge) ×1 IMPLANT
SPONGE PEANUT C5 HOLDER DISSECTOR XRAY DETECTABLE OD3/8 IN DUKAL (Sponge) ×1 IMPLANT
SPONGE PEANUT RADPQE STRL3/8IN (Sponge) ×2
SPONGE SUPER KRLX MED 6X6.75IN (Dressing) ×1
SUPPORT SCROTAL LARGE ADJUSTABLE ELASTIC (Patient Supply) ×1 IMPLANT
SUPPORT SCRT CTTN LG B-B LTX NS ADJ ELC (Patient Supply) ×1
SUPPORT SCRT CTTN MED B-B LTX NS ADJ ELC (Patient Supply) ×1 IMPLANT
SUPPORTER SWIMMER S10 NYLON 11 (Dressing) ×1 IMPLANT
SUSPENSORY W/LEG STRAPS LARGE (Patient Supply) ×1
SUSPENSORY W/LEG STRAPS MEDIUM (Patient Supply) ×1
SUTURE CHROMIC 3-0 SH 27IN (Suture) ×4 IMPLANT
TRAY SKIN DRY SCRUB (Tray) ×2 IMPLANT

## 2017-04-02 NOTE — Discharge Instructions (Signed)
Anesthesia Discharge Instructions: After Your Surgery  You've just had surgery. During surgery you were given medicine called anesthesia to keep you relaxed and comfortable. After surgery you may have some pain or nausea. This is common. Your doctor or nurse will show you how to take care of yourself when you go home. He or she will also answer your questions. Here are some tips for feeling better and getting well after surgery.        For the first 24 hours after your surgery:   Do not drive or use heavy equipment.    Do not make important decisions or sign legal papers.   Do not drink alcohol.   Have someone stay with you, if needed. He or she can watch for problems and help keep you safe.   Have an adult family member or friend drive you home.    Managing pain  If you have pain after surgery, pain medicine will help you feel better. Take it as ordered by your surgeon, before pain becomes severe. Also, ask your doctor about other ways to control pain. This might be with rest, ice, repositioning, and elevation. Follow any other instructions your surgeon or nurse gives you.    Tips for taking pain medicine:    Stay on schedule with your medication   Most pain relievers taken by mouth need at least 20 to 30 minutes to start to work.   Taking medicine on a schedule can help you remember to take it. Try to time your medicine so that you can take it before starting an activity. This might be before you get dressed, go for a walk, or sit down for dinner.    Common side effects of prescription pain medications   Pain medicines can cause nausea. Eat a little food before taking pain medicine to avoid this.   Constipation is a common side effect of pain medicines. Drinking lots of fluids and eating foods, such as fruits and vegetables, that are high in fiber can also help.    Call your doctor before taking any medicines such as laxatives or stool softeners to help ease constipation. Also, ask if you should avoid  any foods. Remember, do not take laxatives unless your surgeon has prescribed them   Drinking alcohol and taking pain medicines can cause dizziness and slow your breathing. It can even be deadly. Do not drink alcohol while taking pain medicines.   Pain medicines can make you react more slowly to things. Do not drive or run machinery while taking pain medicines.    Important facts about Acetaminophen (Tylenol)   Acetaminophen or Tylenol is a common pain reliever.   Check your prescription pain medication labels to see if it contains acetaminophen.   Your health care provider may tell you to take acetaminophen to help ease your pain. Ask him or her how much you should to take each day.    Some prescription pain medicines have acetaminophen and other ingredients. Using both prescription pain medicines and over the counter (OTC) acetaminophen for pain can cause you to overdose.   Max dose of acetaminophen is 4000mg/24 hours for most people. Overdosing on acetaminophen may lead to liver failure.   Read the labels on your (over the counter) medicines with care. This will help you to clearly know the list of ingredients, how much to take, and any warnings. This will prevent you from taking too much acetaminophen.   If you have questions or do not understand the   information, ask your pharmacist or health care provider to explain it to you before you take the OTC medicine.    Managing nausea  Some people have an upset stomach after surgery. This is often because of anesthesia, pain, pain medicines, or the stress of surgery. If you were on a special food plan before surgery, ask your doctor if you should follow it while you get better. The following are tips to help you manage nausea after anesthesia:   Do not push yourself to eat. Your body will tell you when to eat and how much.   Start off with clear liquids and soup. They are easier to digest.   Next try semi-solid foods such as mashed potatoes, applesauce,  and gelatin, as you feel ready.   Slowly move to solid foods. Don't eat fatty, rich, or spicy foods at first.   Do not force yourself to have 3 large meals a day. Instead eat smaller amounts more often.   Take pain medicines with a small amount of food, such as crackers or toast, to avoid nausea.     Call your surgeon if:   You have worsening pain an hour after taking pain medicine. The pain medicine may not be strong enough.   You feel too sleepy, dizzy, or groggy. The pain medicine may be too strong.   You have side effects like persistent nausea, vomiting, or skin changes such as rash, itching, or hives.    You have bleeding through your dressing or your dressing becomes saturated.   You have signs of infection such as redness, fever, or increased/foul smelling drainage.      If you have obstructive sleep apnea (OSA)  You were given anesthesia medicine during surgery to keep you comfortable and free of pain. After surgery, you may have more apnea spells because of this medicine and other medicines you were given. These episodes may last longer than usual.   At home:   Keep using the continuous positive airway pressure (CPAP) device when you sleep. Unless your health care provider tells you not to, use it when you sleep,   or take a nap; day or night. CPAP is a common device used to treat OSA.   *    Sleep/nap in upright position(ie. with pillows), or use a recliner for first week  and/or while on opioid medications or medications that are sedating.  *     Another adult needs to be with you at all times during the first 24 hours home.  *     Limit opioid use when possible and use alternative comfort measures.  * Talk with your provider before taking any pain medicine, muscle relaxants, or  sedatives. Your provider will tell you about the possible dangers of taking these         medicines.

## 2017-04-02 NOTE — Brief Op Note (Signed)
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 Mayra Neer          Specimen Information    Specimen Testing Required Routine Pathology       Specimen ID  [id number]       Specimen Description Spermatocele; left           Findings:   Large left spermatocele    Complications:   None      Signed by: Otelia Santee, MD                                                                              [facility]

## 2017-04-02 NOTE — Discharge Instr - AVS First Page (Signed)
GENERAL DISCHARGE INSTRUCTIONS     GENERAL INFORMATION:   . Do not drive a car or operate machinery for 24 hours.  . Do not consume alcohol, tranquilizers, sleeping medications, or any non-prescribed medications for 24 hours unless approved by your physician.    . Do not make important decisions or sign any important papers in the next 24 hours.   . Please have a responsible person with you tonight.     ACTIVITY:   . Rest for the remainder of the day.   . May return to work or school in 3 days or when advised by your physician.   . May drive in 2 days or when advised by your physician.     TREATMENT:  . Ice to incision, over jock strap, for 24 hours intermittently.   . No bath for 7 days.   . No shower for 2 days.   Marland Kitchen Keep dressing/stitches clean and dry.  . Do not remove dressing until advised by your surgeon.   . Dressing may be removed tomorrow morning  . Care of incision: Antibiotic ointment 2x/day    MEDICATIONS:  . No aspirin or aspirin-containing products until advised by your physician.  . Use pain medication as directed by your physician.    DIET:  . Begin with clear liquids and may progress to your normal diet if not nauseated. Avoid greasy and spicy foods.     NOTIFY SURGEON IF:  . Persistent nausea or vomiting.  . Chills, fever (above 101 degrees F).   . Persistent bleeding or swelling at operative site.  . Unable to urinate in 6-8 hours and abdomen is distended.  . Pain that is not relieved by pain medication.      Otelia Santee, MD 04/02/2017

## 2017-04-02 NOTE — Transfer of Care (Signed)
Anesthesia Transfer of Care Note    Patient: [name] [name]    Procedures performed: Procedure(s):  SPERMATOCELECTOMY    Anesthesia type: General LMA    Patient location:PACU    Last vitals:   Vitals:    04/02/17 0950   BP: 123/70   Pulse: (!) 59   Resp: 14   Temp: 36.3 C (97.3 F)   SpO2: 99%       Post pain: Patient not complaining of pain, continue current therapy      Mental Status:awake and alert     Respiratory Function: tolerating face mask    Cardiovascular: stable    Nausea/Vomiting: patient not complaining of nausea or vomiting    Hydration Status: adequate    Post assessment: no apparent anesthetic complications, no reportable events and no evidence of recall     Report given to PACU RN, VSS, airway patent    Signed by: Serita Sheller  04/02/17 9:51 AM

## 2017-04-02 NOTE — H&P (Signed)
[facility]   HISTORY AND PHYSICAL EXAM      Date Time: 04/02/17 7:49 AM  Patient Name: [name],[name]      History of Presenting Illness:   66 year-old male with left spermatocele for elective left spermatocelectomy.    Past Medical History:     Past Medical History:   Diagnosis Date   . Arthritis     Knees   . Bilateral cataracts 12/05/2010    Surgery on both eyes   . Chronic gout    . Complication of anesthesia 08/012009    Takes longer than usual for me to recover, Disorientation x 1 hour, Slight Memory loss 1x    . Disorder of prostate     Enlarged.   . Diverticulosis of colon without diverticulitis    . GERD (gastroesophageal reflux disease)    . Headache     Occasional   . Heart murmur 02/03/1954    Had murmur since I was a child   . Herpes simplex without mention of complication    . Hiatal hernia 32yrs    Controlled   . Hyperlipidemia    . Hypertension     controlled, Checks 1x week, Avg 135/80   . Irritable bowel syndrome 02/04/1999    Controlled   . Low back pain    . Multiple sclerosis 1995   . Pancreatitis 10/05/2014    Creon       Past Surgical History:     Past Surgical History:   Procedure Laterality Date   . COLONOSCOPY  2015    x 5   . COLONOSCOPY N/A 02/13/2016    Procedure: COLONOSCOPY;  Surgeon: Homero Fellers, MD;  Location: [facility];  Service: Gastroenterology;  Laterality: N/A;  COLONOSCOPY W/IVA   . EUS (ENDOSCOPIC ULTRASOUND)  2016   . EUS (ENDOSCOPIC ULTRASOUND)  10/2016   . EYE SURGERY Bilateral 2012    bil cataracts removed 6/12   . TONSILLECTOMY  1954    at age 35       Family History:     Family History   Problem Relation Age of Onset   . Cancer Mother    . Arrhythmia Mother    . Atrial fibrillation Mother    . Hypertension Mother    . Heart disease Father    . Myocardial Infarction Father 25   . Dementia Father    . Cancer Father    . Hyperlipidemia Father    . Diabetes Father        Social History:     Social History     Social History   . Marital status: Married      Spouse name: N/A   . Number of children: N/A   . Years of education: N/A     Social History Main Topics   . Smoking status: Never Smoker   . Smokeless tobacco: Never Used   . Alcohol use No      Comment: 1 glass per year   . Drug use: No   . Sexual activity: Not on file     Other Topics Concern   . Not on file     Social History Narrative   . No narrative on file       Allergies:     Allergies   Allergen Reactions   . Erythromycin Anaphylaxis and Fever   . Allopurinol Hives     Family hx of problems with allopurinol  Medications:     Prior to Admission medications    Medication Sig Start Date End Date Taking? Authorizing Provider   acyclovir (ZOVIRAX) 200 MG capsule Take 200 mg by mouth every third day.       Yes [provider]   aspirin 81 MG tablet Take 81 mg by mouth daily.     Yes [provider]   B Complex Vitamins (VITAMIN B COMPLEX IJ)  02/03/14  Yes [provider]   Cholecalciferol ([facility] VIT D 5000 HIGH-POTENCY) 5000 UNITS capsule Take 7,000 Units by mouth daily.      Yes [provider]   esomeprazole (NEXIUM) 40 MG capsule Take by mouth daily.     06/18/14  Yes [provider]   gemfibrozil (LOPID) 600 MG tablet Take 600 mg by mouth 2 (two) times daily before meals.     Yes [provider]   GILENYA 0.5 MG Cap Take 0.5 mg by mouth daily.    09/14/14  Yes [provider]   Glucosamine Sulfate 750 MG CAPS Take 500 mg by mouth 2 (two) times daily.      Yes [provider]   lisinopril (PRINIVIL,ZESTRIL) 10 MG tablet Take 10 mg by mouth daily.     Yes [provider]   Multiple Vitamin (MULTIVITAMIN) tablet Take 1 tablet by mouth daily.     Yes [provider]   Probiotic Product (PH[facility] COLON HEALTH PO) Take by mouth every evening.   Yes [provider]   zolpidem (AMBIEN) 10 MG tablet Take 10 mg by mouth nightly as needed.    07/07/14  Yes [provider]   Febuxostat (ULORIC) 40 MG tablet Take 40  mg by mouth daily.      [provider]   oxyCODONE-acetaminophen (PERCOCET) 5-325 MG per tablet Take 1-2 tablets by mouth every 4 (four) hours as needed for Pain. 01/17/17   Lucita Ferrara, PA       Physical Exam:   Constitutional: Well-appearing  Chest: Normal chest rise  Cardiovascular: Regular rate  Abdomen: Soft, non-tender  Neuro/Pysch: Alert, oriented and grossly non-focal    Assessment & Plan:   Patient has been evaluated and is deemed safe to proceed forward with the procedure discussed.   They have been given ample opportunity to ask appropriate questions and those questions have been answered to their satisfaction.   The consent form has been signed, witnessed and placed into the chart.        Signed by: Otelia Santee, MD

## 2017-04-02 NOTE — Anesthesia Postprocedure Evaluation (Signed)
Anesthesia Post Evaluation    Patient: [name] [name]    Procedure(s):  SPERMATOCELECTOMY    Anesthesia type: General LMA    Last Vitals:   Vitals:    04/02/17 1145   BP: 138/72   Pulse: 63   Resp: 16   Temp: 36.7 C (98 F)   SpO2: 98%       Anesthesia post recovery area: Patient discharged prior to evaluation. Please see PACU record for VS, pain, mental/respiratory/hydration status at time of discharge. There were no adverse anesthetic complications.      Post Pain: Other - See comments    Mental Status: other - See comments    Respiratory Function: other - See comments    Cardiovascular: other - See comments    Nausea/Vomiting: other - See comments    Hydration Status: other - See comments    Post Assessment: no apparent anesthetic complications, no reportable events and no evidence of recall          Anesthesia Qualified Clinical Data Registry 2018    PACU Reintubation  Did the Patient have general anesthesia with intubation: No        PONV Adult  Is the patient aged 50 or older: Yes  Did the patient receive recieve a general anesthestic: Yes  Does the patient have 3 or more risk factors for PONV? Yes  Did the patient receive anti-emetics from at least two classes of medications? Yes      PONV Pediatric  Is the patient aged 52-17? No            PACU Transfer Checklist Protocol  Was the patient transferred to the PACU at the conclusion of surgery? Yes  Was a checklist or transfer protocol used? Yes    ICU Transfer Checklist Protocol  Was the patient transferred to the ICU at the conclusion of surgery? No      Post-op Pain Assessment Prior to Anesthesia Care End  Age >=18 and assessed for pain in PACU: Yes  Pacu pain score <7/10: Yes      Perioperative Mortality  Perioperative mortality prior to Anesthesia end time: No    Perioperative Cardiac Arrest  Did the patient have an unanticipated intraoperative cardiac arrest between anesthesia start time and anesthesia end time? No    Unplanned Admission to ICU  Did the  patient have an unplanned admission to the ICU (not initially anticipated at anesthesia start time)? No      Signed by: Elmon Kirschner, 04/02/2017 1:28 PM

## 2017-04-03 NOTE — Op Note (Signed)
Procedure Date: 04/02/2017     Patient Type: A     SURGEON: Baldwin Crown MD  ASSISTANT:       PREOPERATIVE DIAGNOSIS:  Left spermatocele.     POSTOPERATIVE DIAGNOSIS:  Left spermatocele.     TITLE OF PROCEDURE:  Left spermatocelectomy.     ANESTHESIA:  General.     INDICATIONS FOR PROCEDURE:  The patient is a 66 year old male with a longstanding left-sided  spermatocele which has been quite bothersome for the patient given the fact  that it has enlarged in size.  He now presents for elective left  spermatocelectomy.  The risks and benefits of that procedure were discussed  with the patient in detail, informed consent was obtained.  A conscious  decision was made on the part of the patient to proceed.     DESCRIPTION OF PROCEDURE:  The patient was brought to the operating room and placed in the supine  fashion on the operating table.  Following shaving of the patient's  scrotum, he was then prepared and draped in the usual sterile fashion.  A  sterile marking pen was used to mark a line transversely across the left  hemiscrotum measuring approximately 5 cm in length.  A 15 blade was used to  incise along this line down through the scrotal wall to the level of the  tunica.  Blunt dissection of the plane between the tunica vaginalis and  scrotal wall was then undertaken with some attachments carefully released  with a needle-tip electrocautery.  The testicle was then delivered into the  field revealing the large left-sided spermatocele emanating from the medial  aspect of the left epididymal head.  Careful dissection of the spermatocele  off of the cord, testis and other surrounding structures was then  undertaken.  A combination of blunt and sharp dissection was used to  facilitate dissection of the spermatocele which was kept intact until  dissecting it down to its attachment with the epididymal head.  Care was  taken to obtain meticulous hemostasis and avoid injury to any of the cord  structures, epididymis or  testicle during the dissection.  At this point, a  hemostat was applied at the base of the spermatocele and the spermatocele  was excised.  The tissue was then sent for surgical specimen.  The base of  the spermatocele was then suture ligated with 3-0 Vicryl suture material.   Hemostasis was then achieved at the level of the tunica and within the  dartos after everting the scrotum.  The testis was oriented in the proper  manner with the lateral sulcus placed laterally.  The cord was noted to be  straight and not twisted upon returning the testis to the hemiscrotum.  The  dartos fascia was then closed with a running locking 3-0 Vicryl suture, and  then the skin was closed with 3-0 chromic vertical mattress sutures.  Local  anesthetic was injected.  Bacitracin ointment, fluffy dressings, and a  scrotal support were applied.  The patient tolerated the procedure quite  well.  He was transferred to a stretcher and then to the recovery room in  stable condition.           D:  04/02/2017 16:58 PM by Dr. Tawanna Cooler B. Katena Petitjean, MD [phone])  T:  04/03/2017 06:56 AM by NTS      (Conf: [id number]) (Doc ID: [id number])

## 2017-04-05 ENCOUNTER — Encounter: Payer: Self-pay | Admitting: Urology

## 2017-04-06 LAB — LAB USE ONLY - HISTORICAL SURGICAL PATHOLOGY

## 2017-07-06 DIAGNOSIS — C4490 Unspecified malignant neoplasm of skin, unspecified: Secondary | ICD-10-CM

## 2017-07-06 HISTORY — DX: Unspecified malignant neoplasm of skin, unspecified: C44.90

## 2017-10-14 ENCOUNTER — Other Ambulatory Visit: Payer: Self-pay | Admitting: Neurology

## 2017-11-25 ENCOUNTER — Other Ambulatory Visit: Payer: Self-pay

## 2018-01-21 ENCOUNTER — Other Ambulatory Visit: Payer: Self-pay

## 2018-02-28 ENCOUNTER — Ambulatory Visit (INDEPENDENT_AMBULATORY_CARE_PROVIDER_SITE_OTHER): Payer: BLUE CROSS/BLUE SHIELD | Admitting: Family

## 2018-02-28 ENCOUNTER — Other Ambulatory Visit (INDEPENDENT_AMBULATORY_CARE_PROVIDER_SITE_OTHER): Payer: Self-pay | Admitting: Family

## 2018-02-28 DIAGNOSIS — R0609 Other forms of dyspnea: Secondary | ICD-10-CM

## 2018-02-28 DIAGNOSIS — R0600 Dyspnea, unspecified: Secondary | ICD-10-CM

## 2018-02-28 DIAGNOSIS — R9439 Abnormal result of other cardiovascular function study: Secondary | ICD-10-CM

## 2018-02-28 DIAGNOSIS — M79602 Pain in left arm: Secondary | ICD-10-CM

## 2018-02-28 DIAGNOSIS — R9431 Abnormal electrocardiogram [ECG] [EKG]: Secondary | ICD-10-CM

## 2018-02-28 NOTE — Procedures (Signed)
[facility]  Exercise Stress Test    Patient: [name] Young  Sex: male  DOB: [birth year]  MRN:  [id number]     Test Date:  02/28/2018     Interpretation Date: 02/28/2018    Referring Physician:    Rendering Physician: Fernande Boyden, DNP NP    INDICATION:   1. DOE (dyspnea on exertion)        GRADED ECG EXERCISE TEST:  Resting ECG: Normal sinus rhythm, normal intervals and axis, TWI lead III. 67 bpm  Blood Pressure:  Rest = 158/98  Maximum = 188/82  Heart Rate:     Rest = 67  maximum = 154 (101% MPHR).   METS achieved: 10.1    Patient Symptoms: no significant symptoms    Standard [name] Protocol exercise time: 9 minutes, 29 seconds.      Reason for end: fatigue    Achieved target heart rate: Yes    Ischemic ST changes at peak exercise: Yes,2 mm horizontal ST depression in lead II and 1 mm horizontal ST depression in leads III, V3-V6    Arrhythmias: none    CONCLUSION:  1. Electrocardiographic evidence of exercise-induced myocardial ischemia at the heart rate achieved. The study was reviewed by Dr. Ernestine Mcmurray who recommended a nuclear stress test, which was ordered.  2. Normal blood pressure response to exercise and above average exercise capacity for age and gender.      Signed by: Fernande Boyden, DNP, FNP-BC  [facility] [place]

## 2018-03-01 ENCOUNTER — Inpatient Hospital Stay
Admission: RE | Admit: 2018-03-01 | Discharge: 2018-03-01 | Disposition: A | Discharge status: Home / Self Care | Payer: BLUE CROSS/BLUE SHIELD | Source: Ambulatory Visit | Attending: Family | Admitting: Family

## 2018-03-01 DIAGNOSIS — R9439 Abnormal result of other cardiovascular function study: Secondary | ICD-10-CM | POA: Insufficient documentation

## 2018-03-01 DIAGNOSIS — R0609 Other forms of dyspnea: Secondary | ICD-10-CM | POA: Insufficient documentation

## 2018-03-01 DIAGNOSIS — R9431 Abnormal electrocardiogram [ECG] [EKG]: Secondary | ICD-10-CM | POA: Insufficient documentation

## 2018-03-01 DIAGNOSIS — R0600 Dyspnea, unspecified: Secondary | ICD-10-CM

## 2018-03-01 DIAGNOSIS — M79602 Pain in left arm: Secondary | ICD-10-CM | POA: Insufficient documentation

## 2018-03-01 MED ORDER — TECHNETIUM TC 99M TETROFOSMIN IV KIT
35.0000 | PACK | Freq: Once | INTRAVENOUS | Status: AC | PRN
Start: 2018-03-01 — End: 2018-03-01
  Administered 2018-03-01: 12:00:00 35 via INTRAVENOUS
  Filled 2018-03-01: qty 100

## 2018-03-01 MED ORDER — TECHNETIUM TC 99M TETROFOSMIN IV KIT
10.0000 | PACK | Freq: Once | INTRAVENOUS | Status: AC | PRN
Start: 2018-03-01 — End: 2018-03-01
  Administered 2018-03-01: 11:00:00 10 via INTRAVENOUS
  Filled 2018-03-01: qty 100

## 2018-03-02 ENCOUNTER — Telehealth (INDEPENDENT_AMBULATORY_CARE_PROVIDER_SITE_OTHER): Payer: Self-pay | Admitting: Family

## 2018-03-02 NOTE — Telephone Encounter (Signed)
LM for pt that nuc stress test was normal.

## 2018-04-10 ENCOUNTER — Other Ambulatory Visit: Payer: Self-pay

## 2018-04-22 ENCOUNTER — Ambulatory Visit (INDEPENDENT_AMBULATORY_CARE_PROVIDER_SITE_OTHER): Payer: BLUE CROSS/BLUE SHIELD | Admitting: Cardiovascular Disease

## 2018-04-22 ENCOUNTER — Encounter (INDEPENDENT_AMBULATORY_CARE_PROVIDER_SITE_OTHER): Payer: Self-pay | Admitting: Cardiovascular Disease

## 2018-04-22 VITALS — BP 134/75 | HR 76 | Ht 69.0 in | Wt 181.0 lb

## 2018-04-22 DIAGNOSIS — E7849 Other hyperlipidemia: Secondary | ICD-10-CM

## 2018-04-22 DIAGNOSIS — R0602 Shortness of breath: Secondary | ICD-10-CM

## 2018-04-22 DIAGNOSIS — I10 Essential (primary) hypertension: Secondary | ICD-10-CM

## 2018-04-22 NOTE — Progress Notes (Signed)
[facility] [place] OFFICE CONSULTATION    I had the pleasure of seeing [name] Young today for Shortness of Breath (Only while walking up hills)    He is 67 year old gentleman with hypertension, hyperlipidemia, prior atypical chest discomfort who has been having some dyspnea on exertion walking up hills.  He walks up to 3 miles on level ground and does fine; also on the treadmill has no problem but going up 1 flight of stairs or up a hill has to stop and rest.  He denies any exertional type chest discomfort and he has no paroxysmal nocturnal dyspnea.  Denies any prior pulmonary history and no awareness of asthma or wheezing.  Exercises 5 times a week for 35 minutes or more.  Says he had similar shortness of breath on hills when last seen in March 2016 with Dr. Lyman Young.    Risk factor hypertension on lisinopril and hypertriglyceridemia.  Tried 20 mg lisinopril and developed some orthostatic changes and went back to 10 mg daily.    Recently had a regular stress test which had ST depression and had a follow-up nuclear study which was normal with no ischemia and normal ejection fraction unchanged from 2016 study.  He has had false positive routine stress test in the past.  Also has been told in the past of some mild mitral regurgitation.  He was last seen here 09/17/2014 with Dr. Lyman Young.    PAST MEDICAL HISTORY: He has a past medical history of Arthritis, Bilateral cataracts (12/05/2010), Chronic gout, Complication of anesthesia (08/012009), Disorder of prostate, Diverticulosis of colon without diverticulitis, GERD (gastroesophageal reflux disease), Headache, Heart murmur (02/03/1954), Herpes simplex without mention of complication, Hiatal hernia (61yrs), Hyperlipidemia, Hypertension, Irritable bowel syndrome (02/04/1999), Low back pain, Multiple sclerosis (1995), and Pancreatitis (10/05/2014). He has a past surgical history that includes Tonsillectomy (1954); Colonoscopy (2015); Eye surgery (Bilateral, 2012); EUS  (ENDOSCOPIC ULTRASOUND) (2016); Colonoscopy (N/A, 02/13/2016); EUS (ENDOSCOPIC ULTRASOUND) (10/2016); and Spermatocelectomy (Left, 04/02/2017).    MEDICATIONS:   Current Outpatient Medications   Medication Sig   . acyclovir (ZOVIRAX) 200 MG capsule Take 200 mg by mouth every other day      . B Complex Vitamins (VITAMIN B COMPLEX IJ)    . Cholecalciferol ([employer]S VIT D 5000 HIGH-POTENCY) 5000 UNITS capsule Take by mouth daily      . esomeprazole (NEXIUM) 40 MG capsule Take by mouth daily.       . Febuxostat (ULORIC) 40 MG tablet Take 40 mg by mouth daily.     Marland Kitchen gemfibrozil (LOPID) 600 MG tablet Take 600 mg by mouth 2 (two) times daily before meals.     Marland Kitchen GILENYA 0.5 MG Cap Take 0.5 mg by mouth daily.      . Glucosamine Sulfate 750 MG CAPS Take 500 mg by mouth 2 (two) times daily.      Marland Kitchen lisinopril (PRINIVIL,ZESTRIL) 10 MG tablet Take 10 mg by mouth daily.     . Multiple Vitamin (MULTIVITAMIN) tablet Take 1 tablet by mouth daily.     . Probiotic Product (PHILLIPS COLON HEALTH PO) Take by mouth every evening.   . zolpidem (AMBIEN) 10 MG tablet Take 10 mg by mouth nightly as needed.           ALLERGIES:   Allergies   Allergen Reactions   . Erythromycin Anaphylaxis and Fever   . Allopurinol Hives     Family hx of problems with allopurinol       FAMILY HISTORY: History reviewed.  Strong family  history coronary disease.  Father heart attack at 55.  5 paternal uncles with heart attacks.  Cousin with a heart attack.  Mother had cardiomyopathy and pulmonary hypertension.  Mother and father both with hypertension.    SOCIAL HISTORY: He reports that he has never smoked. He has never used smokeless tobacco. He reports that he does not drink alcohol or use drugs.    REVIEW OF SYSTEMS: Weight stable. No fatigue. No asthma, wheezing, or lung problem.  No ulcer, gall bladder or reflux. No kidney problems. No thyroid history. Neurologic negative. No anemia or bleeding. No claudication.  All other systems were reviewed and negative except  as mentioned above.    PHYSICAL EXAMINATION  General Appearance:  A well-appearing male in no acute distress.    Vital Signs: BP 134/75 (BP Site: Left arm)   Pulse 76   Ht 1.753 m (5\' 9" )   Wt 82.1 kg (181 lb)   BMI 26.73 kg/m    HEENT: Sclera anicteric, conjunctiva without pallor, moist mucous membranes, normal dentition. No arcus.   Neck:  Supple without jugular venous distention. Thyroid nonpalpable. Normal carotid upstrokes without bruits.   Chest: Clear to auscultation bilaterally with good air movement and respiratory effort and no wheezes, rales, or rhonchi   Cardiovascular: Normal S1 and physiologically split S2 without murmurs, gallops or rub.  PMI of normal size and nondisplaced.  Abdomen: Soft, nontender, nondistended, with normoactive bowel sounds. No organomegaly.  No pulsatile masses, or bruits.   Extremities: Warm without edema, clubbing, or cyanosis. All peripheral pulses are full and equal.   Skin: No rash, xanthoma or xanthelasma.   Neuro: Alert and oriented x3. Grossly intact. Strength is symmetrical. Normal mood and affect.     ECG: 02/28/2018.  Normal sinus rhythm.  Normal EKG.  03/15/2017.  Normal sinus rhythm.  Horizontal axis.  Otherwise normal.    LABS: 04/01/2018.  Hemoglobin A1c 5.5.  T3 normal.  T4 normal CBC normal comprehensive metabolic panel normal.  GFR 72.    ECHOCARDIOGRAM: ordered    GXT; 02/28/2018.  9 minutes 29 seconds [name] protocol to 154/min.  No chest discomfort.  2 mm ST depression inferior and lateral leads.  Nuclear study recommended    NUCLEAR STRESS TEST; 03/01/2018.  9 minutes [name] protocol.  No chest discomfort.  1 mm ST depression.  Normal study with no ischemia or scarring.  Normal wall motion.  Ejection fraction 68%.  No change from 09/24/2014.    IMPRESSION/RECOMMENDATIONS:    I am not sure of the cause of his shortness of breath going up hills or steps since he can exercise very vigorously on level ground without any difficulty.  Physical exam is unremarkable.   He does have a false positive routine stress test but recent nuclear study is normal with no evidence of ischemia or scarring, normal wall motion and ejection fraction 68%.    I recommended performing an echocardiogram to check for any valve abnormality since he has a history of mild mitral regurgitation in the past although I do not appreciate a murmur on exam.  Also will obtain a BNP level to check for any evidence of heart failure or fluid overload.    I did discuss with him that if the further studies are unremarkable that I would consider pulmonary evaluation and pulmonary function testing to make sure he does not have any possible exercise-induced asthma contributing to his symptoms.    *This note was generated by the Epic EMR system/ Dragon  speech recognition and may contain inherent errors or omissions not intended by the user. Grammatical errors, random word insertions, deletions, pronoun errors and incomplete sentences are occasional consequences of this technology due to software limitations. Not all errors are caught or corrected. If there are questions or concerns about the content of this note or information contained within the body of this dictation they should be addressed directly with the author for clarification.*

## 2018-04-22 NOTE — Patient Instructions (Addendum)
Schedule echo    BNP level    Please consult with Dr. Timothy Lasso and Odette Horns' group (pulmonologist). Dr. Lajuan Lines for sleep apnea study.  Phone Number: [phone]   Address: [address] [place], [place] [address].

## 2018-04-29 ENCOUNTER — Ambulatory Visit
Admission: RE | Admit: 2018-04-29 | Discharge: 2018-04-29 | Disposition: A | Discharge status: Home / Self Care | Payer: BLUE CROSS/BLUE SHIELD | Source: Ambulatory Visit | Attending: Cardiovascular Disease | Admitting: Cardiovascular Disease

## 2018-04-29 ENCOUNTER — Telehealth (INDEPENDENT_AMBULATORY_CARE_PROVIDER_SITE_OTHER): Payer: Self-pay | Admitting: Cardiovascular Disease

## 2018-04-29 DIAGNOSIS — R0602 Shortness of breath: Secondary | ICD-10-CM | POA: Insufficient documentation

## 2018-04-29 DIAGNOSIS — E7849 Other hyperlipidemia: Secondary | ICD-10-CM | POA: Insufficient documentation

## 2018-04-29 DIAGNOSIS — I10 Essential (primary) hypertension: Secondary | ICD-10-CM | POA: Insufficient documentation

## 2018-04-29 NOTE — Telephone Encounter (Signed)
Echo study shows normal heart function and no significant valve abnormality.  Would not account for his shortness of breath up hills.  Was supposed to have a BNP level done.  Should check with Dr. Andrey Campanile about considering pulmonary evaluation or pulmonary function tests

## 2018-04-29 NOTE — Telephone Encounter (Signed)
Patient aware. Patient reports having BNP done a few days ago and was "negative."    I will contact PCP for results.

## 2018-05-19 ENCOUNTER — Other Ambulatory Visit: Payer: Self-pay | Admitting: Pulmonary Disease

## 2018-05-26 ENCOUNTER — Other Ambulatory Visit: Payer: Self-pay | Admitting: Gastroenterology

## 2018-06-04 ENCOUNTER — Telehealth: Payer: BLUE CROSS/BLUE SHIELD

## 2018-06-04 NOTE — Pre-Procedure Instructions (Signed)
   No pre-op testing requested/required for this procedure

## 2018-06-09 ENCOUNTER — Ambulatory Visit: Payer: BLUE CROSS/BLUE SHIELD | Admitting: Anesthesiology

## 2018-06-09 ENCOUNTER — Encounter
Admission: RE | Disposition: A | Discharge status: Home / Self Care | Payer: Self-pay | Source: Ambulatory Visit | Attending: Gastroenterology

## 2018-06-09 ENCOUNTER — Encounter: Payer: Self-pay | Admitting: Anesthesiology

## 2018-06-09 ENCOUNTER — Ambulatory Visit: Payer: Self-pay

## 2018-06-09 ENCOUNTER — Ambulatory Visit
Admission: RE | Admit: 2018-06-09 | Discharge: 2018-06-09 | Disposition: A | Discharge status: Home / Self Care | Payer: BLUE CROSS/BLUE SHIELD | Source: Ambulatory Visit | Attending: Gastroenterology | Admitting: Gastroenterology

## 2018-06-09 DIAGNOSIS — D374 Neoplasm of uncertain behavior of colon: Secondary | ICD-10-CM | POA: Insufficient documentation

## 2018-06-09 DIAGNOSIS — G35 Multiple sclerosis: Secondary | ICD-10-CM | POA: Insufficient documentation

## 2018-06-09 DIAGNOSIS — I10 Essential (primary) hypertension: Secondary | ICD-10-CM | POA: Insufficient documentation

## 2018-06-09 DIAGNOSIS — D122 Benign neoplasm of ascending colon: Secondary | ICD-10-CM | POA: Insufficient documentation

## 2018-06-09 DIAGNOSIS — K648 Other hemorrhoids: Secondary | ICD-10-CM | POA: Insufficient documentation

## 2018-06-09 DIAGNOSIS — Z85828 Personal history of other malignant neoplasm of skin: Secondary | ICD-10-CM | POA: Insufficient documentation

## 2018-06-09 DIAGNOSIS — D123 Benign neoplasm of transverse colon: Secondary | ICD-10-CM | POA: Insufficient documentation

## 2018-06-09 DIAGNOSIS — K219 Gastro-esophageal reflux disease without esophagitis: Secondary | ICD-10-CM | POA: Insufficient documentation

## 2018-06-09 DIAGNOSIS — K5730 Diverticulosis of large intestine without perforation or abscess without bleeding: Secondary | ICD-10-CM | POA: Insufficient documentation

## 2018-06-09 DIAGNOSIS — C772 Secondary and unspecified malignant neoplasm of intra-abdominal lymph nodes: Secondary | ICD-10-CM

## 2018-06-09 DIAGNOSIS — E785 Hyperlipidemia, unspecified: Secondary | ICD-10-CM | POA: Insufficient documentation

## 2018-06-09 DIAGNOSIS — K449 Diaphragmatic hernia without obstruction or gangrene: Secondary | ICD-10-CM | POA: Insufficient documentation

## 2018-06-09 HISTORY — PX: COLONOSCOPY: SHX174

## 2018-06-09 SURGERY — DONT USE, USE 1094-COLONOSCOPY, DIAGNOSTIC (SCREENING)
Anesthesia: Anesthesia General | Site: Anus | Wound class: Clean Contaminated

## 2018-06-09 MED ORDER — PROPOFOL 10 MG/ML IV EMUL (WRAP)
INTRAVENOUS | Status: AC
Start: 2018-06-09 — End: ?
  Filled 2018-06-09: qty 50

## 2018-06-09 MED ORDER — ONDANSETRON HCL 4 MG/2ML IJ SOLN
4.0000 mg | Freq: Once | INTRAMUSCULAR | Status: DC | PRN
Start: 2018-06-09 — End: 2018-06-09

## 2018-06-09 MED ORDER — LIDOCAINE HCL (PF) 2 % IJ SOLN
INTRAMUSCULAR | Status: AC
Start: 2018-06-09 — End: ?
  Filled 2018-06-09: qty 10

## 2018-06-09 MED ORDER — LACTATED RINGERS IV SOLN
INTRAVENOUS | Status: DC
Start: 2018-06-09 — End: 2018-06-09
  Administered 2018-06-09: 14:00:00 1000 mL via INTRAVENOUS

## 2018-06-09 MED ORDER — LIDOCAINE HCL (PF) 2 % IJ SOLN
INTRAMUSCULAR | Status: AC
Start: 2018-06-09 — End: ?
  Filled 2018-06-09: qty 5

## 2018-06-09 MED ORDER — LACTATED RINGERS IV SOLN
50.0000 mL/h | INTRAVENOUS | Status: DC
Start: 2018-06-09 — End: 2018-06-09

## 2018-06-09 MED ORDER — PROMETHAZINE HCL 25 MG/ML IJ SOLN
6.2500 mg | Freq: Once | INTRAMUSCULAR | Status: DC | PRN
Start: 2018-06-09 — End: 2018-06-09

## 2018-06-09 MED ORDER — AMMONIA AROMATIC IN INHA
1.0000 | Freq: Once | RESPIRATORY_TRACT | Status: DC | PRN
Start: 2018-06-09 — End: 2018-06-09

## 2018-06-09 MED ORDER — PROPOFOL INFUSION 10 MG/ML
INTRAVENOUS | Status: DC | PRN
Start: 2018-06-09 — End: 2018-06-09
  Administered 2018-06-09: 30 mg via INTRAVENOUS
  Administered 2018-06-09: 40 mg via INTRAVENOUS
  Administered 2018-06-09: 50 mg via INTRAVENOUS
  Administered 2018-06-09: 30 mg via INTRAVENOUS
  Administered 2018-06-09: 70 mg via INTRAVENOUS
  Administered 2018-06-09: 40 mg via INTRAVENOUS
  Administered 2018-06-09: 30 mg via INTRAVENOUS

## 2018-06-09 SURGICAL SUPPLY — 20 items
APPLICATOR PROCTOSCOPIC 16IN (Applicator) ×2 IMPLANT
CNTNR SPEC W LLDPE 16OZ LEK SHTR RST (Procedure Accessories) ×2
CONTAINER SPEC LLDPE 16OZ W LF NS LEK (Procedure Accessories) ×2 IMPLANT
CONTAINER SPECIMEN C16 OZ WIDE LEAK SHATTER RESISTANT SNAP ON LID (Procedure Accessories) ×2 IMPLANT
ELECTRODE ADULT PATIENT RETURN L9 FT REM POLYHESIVE ACRYLIC FOAM (Procedure Accessories) IMPLANT
ELECTRODE PATIENT RETURN L9 FT VALLEYLAB (Procedure Accessories) IMPLANT
ELECTRODE PT RTN RM PHSV ACRL FM C30- LB (Procedure Accessories)
FORCEP BIOPSY 240CM RADIAL JA (Instrument) IMPLANT
FORCEP BIOPSY 240CM RADIALJAW (Instrument) ×2 IMPLANT
GLOVE NITRILE PREMIERPRO MED (Glove) ×6 IMPLANT
JELLY LUBE STRL FLPTOP 2OZ (Procedure Accessories) ×2 IMPLANT
SNARE ESCP MED OVL CPTFLX 2.4MM 240CM (Endoscopic Supplies) ×1
SNARE MD OVAL 240CM 2.4 MM CPTFLX LP FLXBL ENDOSCOPIC POLYPECTOMY 27MM (Endoscopic Supplies) ×1 IMPLANT
SNARE MEDIUM OVAL L240 CM OD2.4 MM (Endoscopic Supplies) ×1 IMPLANT
SYRINGE 50 ML GRADUATE NONPYROGENIC DEHP (Syringes, Needles) ×1 IMPLANT
SYRINGE 50 ML GRADUATE NONPYROGENIC DEHP FREE PVC FREE BD MEDICAL (Syringes, Needles) ×1 IMPLANT
SYRINGE MED 50ML LF STRL GRAD N-PYRG (Syringes, Needles) ×1
TUBING CONNECTING STERILE 10FT (Tubing) ×1
TUBING SUCTION ID3/16 IN L10 FT (Tubing) ×1 IMPLANT
TUBING SUCTION ID3/16 IN L10 FT NONCONDUCTIVE STRAIGHT MALE FEMALE (Tubing) ×1 IMPLANT

## 2018-06-09 NOTE — H&P (Signed)
GI PRE PROCEDURE NOTE    Proceduralist Comments:   Review of Systems and Past Medical / Surgical History performed: Yes     Indications:mass in distal TI on CT    Previous Adverse Reaction to Anesthesia or Sedation (if yes, describe): No    Physical Exam / Laboratory Data (If applicable)   Airway Classification: Class II    General: Alert and cooperative  Lungs: Lungs clear to auscultation  Cardiac: RRR, normal S1S2.    Abdomen: Soft, non tender. Normal active bowel sounds  Other:     No labs drawn    American Society of Anesthesiologists (ASA) Physical Status Classification:   ASA 2 - Patient with mild systemic disease with no functional limitations    Planned Sedation:   Deep sedation with anesthesia    Attestation:   [name] Young has been reassessed immediately prior to the procedure and is an appropriate candidate for the planned sedation and procedure. Risks, benefits and alternatives to the planned procedure and sedation have been explained to the patient or guardian:  yes        Signed by: Fanny Bien Shanikka Wonders

## 2018-06-09 NOTE — Transfer of Care (Signed)
Anesthesia Transfer of Care Note    Patient: [name] Young    Procedures performed: Procedure(s) with comments:  COLONOSCOPY - COLON W/IVA    Anesthesia type: General TIVA    Patient location:Phase II PACU    Last vitals:   Vitals:    06/09/18 1322   BP: 168/87   Pulse: 72   Resp: 16   Temp: 36.2 C (97.2 F)   SpO2: 99%       Post pain: Patient not complaining of pain, continue current therapy      Mental Status:awake    Respiratory Function: tolerating room air    Cardiovascular: stable    Nausea/Vomiting: patient not complaining of nausea or vomiting    Hydration Status: adequate    Post assessment: no apparent anesthetic complications and no reportable events    Signed by: Lorel Monaco Vanellope Passmore  06/09/18 3:51 PM

## 2018-06-09 NOTE — Anesthesia Preprocedure Evaluation (Signed)
Anesthesia Evaluation    AIRWAY    Mallampati: II    TM distance: >3 FB  Neck ROM: full  Mouth Opening:full   CARDIOVASCULAR    cardiovascular exam normal, regular, normal and murmur       DENTAL    no notable dental hx     PULMONARY    pulmonary exam normal and clear to auscultation     OTHER FINDINGS                  Relevant Problems   No relevant active problems               Anesthesia Plan    ASA 2     general                     intravenous induction   Detailed anesthesia plan: general IV  Monitors/Adjuncts: other    Post Op: other    Post op pain management: per surgeon    informed consent obtained    Plan discussed with CRNA.                 ===============================================================  Inpatient Anesthesia Evaluation    Patient Name: [name] Young,[name] Young  Surgeon: Homero Fellers, MD  Patient Age / Sex: 67 y.o. / male    Medical History:     Past Medical History:   Diagnosis Date   . Arthritis     Knees   . Bilateral cataracts 12/05/2010    Surgery on both eyes   . Chronic gout    . Complication of anesthesia 08/012009    Takes longer than usual for me to recover, Disorientation x 1 hour, Slight Memory loss 1x    . Disorder of prostate     Enlarged.   . Diverticulosis of colon without diverticulitis    . GERD (gastroesophageal reflux disease)    . Headache     Occasional   . Heart murmur 02/03/1954    Had murmur since I was a child   . Herpes simplex without mention of complication    . Hiatal hernia 48yrs    Controlled   . Hyperlipidemia    . Hypertension     controlled, Checks 1x week, Avg 135/80   . Irritable bowel syndrome 02/04/1999    Controlled   . Low back pain    . 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 Multiple sclerosis 1995   . Pancreatitis 10/05/2014    Creon       Past Surgical History:   Procedure Laterality Date   . COLONOSCOPY  2015    x 5   . COLONOSCOPY N/A 02/13/2016    Procedure: COLONOSCOPY;  Surgeon: Homero Fellers, MD;  Location: [facility] OR;   Service: Gastroenterology;  Laterality: N/A;  COLONOSCOPY W/IVA   . EUS (ENDOSCOPIC ULTRASOUND)  2016   . EUS (ENDOSCOPIC ULTRASOUND)  10/2016   . EYE SURGERY Bilateral 2012    bil cataracts removed 6/12   . SPERMATOCELECTOMY Left 04/02/2017    Procedure: SPERMATOCELECTOMY;  Surgeon: Tescher, Hoyle Sauer, MD;  Location: [facility];  Service: Urology;  Laterality: Left;   . TONSILLECTOMY  1954    at age 89         Allergies:     Allergies   Allergen Reactions   . Erythromycin Anaphylaxis and Fever   . Allopurinol Hives     Family hx of problems  with allopurinol         Medications:     Current Facility-Administered Medications   Medication Dose Route Frequency Last Rate Last Dose   . lactated ringers infusion   Intravenous Continuous                  Prior to Admission medications    Medication Sig Start Date End Date Taking? Authorizing Provider   acyclovir (ZOVIRAX) 200 MG capsule Take 200 mg by mouth every other day      Yes [provider]   B Complex Vitamins (VITAMIN B COMPLEX IJ)  02/03/14  Yes [provider]   Cholecalciferol (CVS VIT D 5000 HIGH-POTENCY) 5000 UNITS capsule Take by mouth daily      Yes [provider]   esomeprazole (NEXIUM) 40 MG capsule Take by mouth daily.     06/18/14  Yes [provider]   Febuxostat (ULORIC) 40 MG tablet Take 40 mg by mouth daily.     Yes [provider]   gemfibrozil (LOPID) 600 MG tablet Take 600 mg by mouth 2 (two) times daily before meals.     Yes [provider]   Glatiramer Acetate 40 MG/ML Solution Prefilled Syringe Monday, Wednesday and friday   06/01/18  Yes [provider]   Glucosamine Sulfate 750 MG CAPS Take 500 mg by mouth 2 (two) times daily.      Yes [provider]   lisinopril (PRINIVIL,ZESTRIL) 10 MG tablet Take 10 mg by mouth daily.     Yes [provider]   Multiple Vitamin (MULTIVITAMIN) tablet Take 1 tablet by mouth daily.     Yes [provider]    Probiotic Product (PHILLIPS COLON HEALTH PO) Take by mouth every evening.   Yes [provider]   zolpidem (AMBIEN) 10 MG tablet Take 10 mg by mouth nightly as needed.    07/07/14  Yes [provider]     Vitals        Wt Readings from Last 3 Encounters:   06/04/18 82.1 kg (181 lb)   04/22/18 82.1 kg (181 lb)   03/01/18 83.3 kg (183 lb 10.3 oz)     BMI (Estimated body mass index is 26.73 kg/m as calculated from the following:    Height as of this encounter: 1.753 m (5\' 9" ).    Weight as of this encounter: 82.1 kg (181 lb).)  Temp Readings from Last 3 Encounters:   04/02/17 36.7 C (98 F) (Temporal Artery)   01/17/17 36.7 C (98.1 F)   02/13/16 36.4 C (97.6 F) (Temporal Artery)     BP Readings from Last 3 Encounters:   04/22/18 134/75   04/02/17 138/72   01/17/17 118/66     Pulse Readings from Last 3 Encounters:   04/22/18 76   04/02/17 63   01/17/17 64           Labs:   CBC:  Lab Results   Component Value Date    WBC 3.74 03/15/2017    HGB 12.8 (L) 03/15/2017    HCT 39.3 (L) 03/15/2017    PLT 250 03/15/2017       Chemistries:  Lab Results   Component Value Date    NA 137 03/15/2017    K 4.3 03/15/2017    CL 105 03/15/2017    CO2 22 03/15/2017    BUN 19.0 03/15/2017    CREAT 1.1 03/15/2017    GLU 113 (H) 03/15/2017  CA 9.7 03/15/2017    AST 22 05/21/2011       Coags:  No results found for: PT, PTT, INR  _____________________      Signed by: Florestine Avers  06/09/18   1:09 PM    =============================================================      Signed by: Florestine Avers 06/09/18 1:08 PM

## 2018-06-09 NOTE — Anesthesia Postprocedure Evaluation (Signed)
Anesthesia Post Evaluation    Patient: [name] Young    Procedure(s) with comments:  COLONOSCOPY - COLON W/IVA    Anesthesia type: general    Last Vitals:   Vitals Value Taken Time   BP 118/55 06/09/2018  3:53 PM   Temp 36.7 C (98 F) 06/09/2018  3:53 PM   Pulse  06/09/2018  4:13 PM   Resp 16 06/09/2018  3:53 PM   SpO2 100 % 06/09/2018  3:53 PM       Anesthesia Post Evaluation:     Patient Evaluated: PACU  Patient Participation: complete - patient participated  Level of Consciousness: awake and alert  Pain Score: 2  Pain Management: adequate    Airway Patency: patent    Anesthetic complications: No      PONV Status: none    Cardiovascular status: acceptable  Respiratory status: acceptable  Hydration status: acceptable        Anesthesia Qualified Clinical Data Registry 2018    PACU Reintubation  Did the Patient have general anesthesia with intubation: No        PONV Adult  Is the patient aged 60 or older: Yes  Did the patient receive recieve a general anesthestic: Yes  Does the patient have 3 or more risk factors for PONV? No  Did the patient receive anti-emetics from at least two classes of medications? Yes      PONV Pediatric  Is the patient aged 26-17? No            PACU Transfer Checklist Protocol  Was the patient transferred to the PACU at the conclusion of surgery? Yes  Was a checklist or transfer protocol used? Yes    ICU Transfer Checklist Protocol  Was the patient transferred to the ICU at the conclusion of surgery? No      Post-op Pain Assessment Prior to Anesthesia Care End  Age >=18 and assessed for pain in PACU: Yes  Pacu pain score <7/10: Yes      Perioperative Mortality  Perioperative mortality prior to Anesthesia end time: No    Perioperative Cardiac Arrest  Did the patient have an unanticipated intraoperative cardiac arrest between anesthesia start time and anesthesia end time? No    Unplanned Admission to ICU  Did the patient have an unplanned admission to the ICU (not initially anticipated at  anesthesia start time)? No      Signed by: Florestine Avers, 06/09/2018 4:13 PM

## 2018-06-10 ENCOUNTER — Encounter: Payer: Self-pay | Admitting: Gastroenterology

## 2018-06-15 LAB — LAB USE ONLY - HISTORICAL SURGICAL PATHOLOGY

## 2018-06-21 ENCOUNTER — Other Ambulatory Visit: Payer: Self-pay | Admitting: Hematology & Oncology

## 2018-08-04 ENCOUNTER — Other Ambulatory Visit: Payer: Self-pay | Admitting: Hematology & Oncology

## 2018-08-04 DIAGNOSIS — C8590 Non-Hodgkin lymphoma, unspecified, unspecified site: Secondary | ICD-10-CM

## 2018-08-30 ENCOUNTER — Ambulatory Visit
Admission: RE | Admit: 2018-08-30 | Discharge: 2018-08-30 | Disposition: A | Discharge status: Home / Self Care | Payer: BLUE CROSS/BLUE SHIELD | Source: Ambulatory Visit | Attending: Hematology & Oncology | Admitting: Hematology & Oncology

## 2018-08-30 DIAGNOSIS — C8590 Non-Hodgkin lymphoma, unspecified, unspecified site: Secondary | ICD-10-CM | POA: Insufficient documentation

## 2018-09-01 ENCOUNTER — Telehealth (INDEPENDENT_AMBULATORY_CARE_PROVIDER_SITE_OTHER): Payer: Self-pay | Admitting: Cardiovascular Disease

## 2018-09-01 NOTE — Telephone Encounter (Signed)
Echo looks good. Normal heart function, trace-mild mitral regurgitation. No change from 04/29/2018. Copy sent to dR. Milford Cage

## 2018-09-02 NOTE — Telephone Encounter (Signed)
Patient aware of 08/30/2018 echocardiogram results.

## 2018-12-23 ENCOUNTER — Other Ambulatory Visit: Payer: Self-pay | Admitting: Neurology

## 2018-12-23 ENCOUNTER — Other Ambulatory Visit: Payer: Self-pay

## 2019-01-03 ENCOUNTER — Other Ambulatory Visit: Payer: Self-pay | Admitting: Neurology

## 2019-03-31 ENCOUNTER — Other Ambulatory Visit: Payer: Self-pay

## 2019-04-11 ENCOUNTER — Telehealth (INDEPENDENT_AMBULATORY_CARE_PROVIDER_SITE_OTHER): Payer: Self-pay | Admitting: Cardiovascular Disease

## 2019-04-11 NOTE — Telephone Encounter (Signed)
Dr.wilson calling to discuss A Fax.    Please advise  T:  [phone]    Next Cardiology OV Visit date not found

## 2019-04-12 ENCOUNTER — Encounter (INDEPENDENT_AMBULATORY_CARE_PROVIDER_SITE_OTHER): Payer: Self-pay | Admitting: Cardiovascular Disease

## 2019-04-12 NOTE — Telephone Encounter (Signed)
Per Dr. Ernestine Mcmurray- Spoke with Dr. Andrey Campanile over the telephone about Mr. Matthews.  Patient had a recent calcium score of 336.  Dr. Andrey Campanile wanted know whether it was okay for him to take Cialis 5 mg daily.  He is not on any nitrates and it is safe for him to take Cialis.    Patient also has hypertriglyceridemia in the past and is maintained on Lopid.  Has seen lipid specialist at [facility] in the past.  Side effects from the Septra and TriCor.  LDL level is slightly high as is total cholesterol and triglyceride 191.  Patient could possibly try very low-dose of a statin 5 mg simvastatin, Lipitor or Crestor but would favor simvastatin since less potent.  On Lopid would have increased possibility of muscle problems.  Could also revisit with lipid specialist at [facility].    Had a negative nuclear stress test last year and does not need to be repeated at this juncture.

## 2019-04-12 NOTE — Progress Notes (Signed)
Spoke with Dr. Andrey Campanile over the telephone about [name] Young.  Patient had a recent calcium score of 336.  Dr. Andrey Campanile wanted know whether it was okay for him to take Cialis 5 mg daily.  He is not on any nitrates and it is safe for him to take Cialis.    Patient also has hypertriglyceridemia in the past and is maintained on Lopid.  Has seen lipid specialist at [facility] in the past.  Side effects from the Septra and TriCor.  LDL level is slightly high as is total cholesterol and triglyceride 191.  Patient could possibly try very low-dose of a statin 5 mg simvastatin, Lipitor or Crestor but would favor simvastatin since less potent.  On Lopid would have increased possibility of muscle problems.  Could also revisit with lipid specialist at [facility].    Had a negative nuclear stress test last year and does not need to be repeated at this juncture.

## 2019-11-06 ENCOUNTER — Encounter: Payer: Self-pay | Admitting: Colon & Rectal Surgery

## 2019-12-12 ENCOUNTER — Telehealth (INDEPENDENT_AMBULATORY_CARE_PROVIDER_SITE_OTHER): Payer: BLUE CROSS/BLUE SHIELD | Admitting: Urology

## 2019-12-12 ENCOUNTER — Encounter (INDEPENDENT_AMBULATORY_CARE_PROVIDER_SITE_OTHER): Payer: Self-pay | Admitting: Urology

## 2019-12-12 DIAGNOSIS — R350 Frequency of micturition: Secondary | ICD-10-CM

## 2019-12-12 DIAGNOSIS — N528 Other male erectile dysfunction: Secondary | ICD-10-CM

## 2019-12-12 DIAGNOSIS — R351 Nocturia: Secondary | ICD-10-CM

## 2019-12-12 NOTE — Patient Instructions (Signed)
Having a Urodynamics Study     The equipment used for the study varies depending upon the facility and what tests are done.   Urodynamic studies are tests that evaluate the bladder, sphincters, and urethra and their ability to store and release urine. Theymay be done inyour healthcare provider's office, a clinic, or a hospital. The studies may take up to an hour or longer. This depends on which tests you have. The tests are generally painless. A small tube (catheter) will be placed into your bladder and in your rectum. You won't need sedating medicine.  Tests that may be done   Uroflowmetry. This measures the amount and speed of urine you void from your bladder. You urinate into a funnel. It's attached to a computer that records your urine flow over time. The amount of urine left in your bladder after you urinate may also be measured right after this test.   Cystometry. This test evaluates how muchyour bladder can hold. It also measures how strongyour bladder muscle is. And how well the signals work that tell you when your bladder is full.Your healthcare provider fills your bladder with sterile water or saline solution, through a catheter.Your provider will instruct you to report any sensations you feel. Mention if they're similar to symptoms you've felt at home. Yourprovider may ask you tocough, stand and walk, or bear down during this test.   Electromyogram. This helps evaluate the muscle contractions that control urination, such as sphincter muscle contractions. Your healthcare provider may put electrode patches or wires nearyour rectum or urethra to make the recording.He or shemay ask youto try to tighten or relax your sphincter muscles during this test.   Pressure flow study. This test measuresyour detrusor, urethral, and abdominal pressures. Detrusor is the muscle around the bladder walls. It relaxes to letyour bladder fill. And it contracts to squeeze out urine.A pressure flow studyis  often done after cystometry. You're asked to urinate while a probe in your urethra measures pressures.   Video cystourethrography. This takes video pictures of urine flow through your urinary tract. It can help find blockages or other problems. The bladder is filled with an X-ray contrast fluid. Then X-ray video pictures are taken as the fluid is urinated out.Ultrasound imaging may also be combined with routine urodynamic studies.   Ambulatory urodynamics. This test can be used to evaluate you while doing normal activities.    Getting your results  After the study, you'll get dressed and return to the consultation room. Test results may be ready soon after the study is finished. Or you may go back to your healthcare provider's office in a few days for your results. You can talk with your provider about the study report and your treatment options.  [employer] last reviewed this educational content on 12/04/2017   2000-2020 [employer] [address], [place] [address]. All rights reserved. This information is not intended as a substitute for professional medical care. Always follow your healthcare professional's instructions.

## 2019-12-12 NOTE — Progress Notes (Signed)
Subjective:      Patient ID: [name] Young is a 69 y.o. male     Chief Complaint:  1. Other male erectile dysfunction    2. Urinary frequency    3. Nocturia        Verbal consent has been obtained from the patient to conduct a video and telephone visit to minimize exposure to COVID-19: yes    This is a pleasant 56M.  Visit today is urinary frequency and nocturia.  He was referred by Dr. Mayra Neer for this issue. He reports voids every 2 hours and nocturia x3-4.  There is no significant urgency or UUI.   This has been ongoing for about 10 years and slightly worsening.  He feels that his stream is normal and reportedly had multiple office flow studies which have been normal.  He has tried multiple medications including Detrol, Myrbetriq and Flomax.  He felt that 4 mg of Detrol actually made his symptoms worse.  He is currently on daily Cialis.  He felt that after initially starting the Cialis symptoms were better but this was not sustained.  He has a history of MS.  He drinks 40 to 50 ounces of fluid daily and symptoms do not seem to be related to fluid intake.  Reports normal PSA with PCP.          The following portions of the patient's history were reviewed and updated as appropriate: allergies, current medications, past family history, past medical history, past social history, past surgical history and problem list.    Review of Systems  Systems reviewed per the HPI and below:     History obtained from the patient     General ROS: no fevers, or chills     Gastrointestinal ROS: no abdominal pain, change in bowel habits     Musculoskeletal ROS:  no swelling to lower extremities, no back pain     Objective:   TELEMEDICINE VISIT    Physical Exam   Constitutional:  Well-developed, well-nourished, and in no distress.   Pulmonary/Chest: Effort normal.   Neurological: Pt is alert and oriented to person, place, and time.    Psychiatric: Mood, memory, affect and judgment normal.       Lab Review   Reviewed results as  listed below. Discussed findings with patient.     Urine analysis shows none      Radiology Review   Reviewed results as listed below. Discussed results with the patient and answered questions to the best of my ability.   none      Assessment:       Urinary frequency  Nocturia  H/o MS     Discussed management of his lower urinary tract symptoms.  Especially in light of his history of MS I recommended urodynamics and cystoscopy to better evaluate.  I explained the testing in detail.  He understands and wishes to proceed.     Plan:   There are no Patient Instructions on file for this visit.    Orders  No orders of the defined types were placed in this encounter.

## 2020-01-26 ENCOUNTER — Encounter (INDEPENDENT_AMBULATORY_CARE_PROVIDER_SITE_OTHER): Payer: Self-pay | Admitting: Urology

## 2020-01-26 ENCOUNTER — Ambulatory Visit (INDEPENDENT_AMBULATORY_CARE_PROVIDER_SITE_OTHER): Payer: BLUE CROSS/BLUE SHIELD | Admitting: Urology

## 2020-01-26 ENCOUNTER — Ambulatory Visit (FREE_STANDING_LABORATORY_FACILITY): Payer: BLUE CROSS/BLUE SHIELD | Admitting: Nurse Practitioner

## 2020-01-26 ENCOUNTER — Encounter (INDEPENDENT_AMBULATORY_CARE_PROVIDER_SITE_OTHER): Payer: Self-pay | Admitting: Nurse Practitioner

## 2020-01-26 ENCOUNTER — Other Ambulatory Visit (INDEPENDENT_AMBULATORY_CARE_PROVIDER_SITE_OTHER): Payer: Self-pay | Admitting: Nurse Practitioner

## 2020-01-26 VITALS — BP 144/78 | HR 71 | Ht 68.15 in | Wt 173.6 lb

## 2020-01-26 DIAGNOSIS — N3941 Urge incontinence: Secondary | ICD-10-CM

## 2020-01-26 DIAGNOSIS — R319 Hematuria, unspecified: Secondary | ICD-10-CM

## 2020-01-26 DIAGNOSIS — R350 Frequency of micturition: Secondary | ICD-10-CM

## 2020-01-26 DIAGNOSIS — G35 Multiple sclerosis: Secondary | ICD-10-CM

## 2020-01-26 DIAGNOSIS — R32 Unspecified urinary incontinence: Secondary | ICD-10-CM

## 2020-01-26 DIAGNOSIS — R351 Nocturia: Secondary | ICD-10-CM

## 2020-01-26 DIAGNOSIS — N3289 Other specified disorders of bladder: Secondary | ICD-10-CM

## 2020-01-26 LAB — URINALYSIS POC
Blood, UA POCT: NEGATIVE
POCT Urine Bilirubin: NEGATIVE
POCT Urine Glucose: NEGATIVE mg/dL
POCT Urine Ketones: NEGATIVE mg/dL
POCT Urine Nitrites: NEGATIVE
POCT Urine Urobilibogen: 0.2 mg/dL (ref 0.0–1.0)
POCT Urine pH: 6 (ref 5.0–8.0)
Protein, UR POCT: NEGATIVE mg/dL
Urine Specific Gravity POC: >=1.03 (ref 1.001–1.035)
Urine leukocyte Esterase, POCT: NEGATIVE

## 2020-01-26 MED ORDER — OXYBUTYNIN CHLORIDE 5 MG PO TABS
5.00 mg | ORAL_TABLET | Freq: Three times a day (TID) | ORAL | 0 refills | Status: AC
Start: 2020-01-26 — End: 2020-02-05

## 2020-01-26 NOTE — Progress Notes (Signed)
Dx: urinary frequency, nocturia, h/o MS  UDS today: DO, no BOO    Male Cystourethroscopy    The patient was placed in the supine position after which the 24 French flexible cystoscope was passed into the urethral meatus with sterile lubricant jelly. The urethra was visualized upon entering. The prostatic urethra was viewed from the veru. The bladder was then entered and filled with adequate fluid to allow good visualization. The lateral, posterior and anterior walls and dome were inspected. The ureteral orifices were identified, with efflux evaluated, and the trigone and bladder neck were also visualized. The cystoscope was then removed while viewing the prostatic urethra and penile urethra in antegrade fashion.    Findings:    Meatus Open    Urethra: Open    Prostate: No enlargement noted, small prostate which is visibly nonobstructing    Bladder: No tumors, lesions, or stones    No trabeculation      Trigone: Normal    Orifices: Normally positioned with clear efflux bilaterally  Note patient has significant urgency with filling    Assessment: Normal cystoscopy, overactive bladder      Notes:   -We discussed that bladder function testing today showed overactivity.  I think he would be a good candidate for Botox.  He will return to discuss this

## 2020-01-26 NOTE — Patient Instructions (Signed)
-   We discussed your testing today.  We discussed that you may be a good candidate for botox.  We will discuss this in further detail at your follow up

## 2020-01-26 NOTE — Procedures (Signed)
Urodynamic Testing  Background Data:  Initially referred  Dr. Carroll Sage June 2021 for further evaluation of frequency, nocturia . he has tried multiple medications including Detrol, Myrbetriq and Flomax.  He felt that 4 mg of Detrol actually made his symptoms worse.  He is currently on daily Cialis.  He felt that after initially starting the Cialis symptoms were better but this was not sustained.  He has a history of MS.  He drinks 40 to 50 ounces of fluid daily and symptoms do not seem to be related to fluid intake    Diagnostic Evaluation    Difficult catheterization: n    Uroflow:  voided volume: 75 ml  maximum flow rate : 4cc/sec  flow pattern: intermittent   voluntary void: Y  PVR on PFUD study:40cc's    CMG (Filling and Storage):   Sensation of filling:   early  Maximum Cystometric capacity: 200      cc     Compliance: normal  (8) at capacity  Detrusor overactivity: present at 200cc and unable to control leaking  VLPP/ALPP/CLPP: no    2nd fill:   Urge at 150cc and +DO at 160cc. Pdet > 45cm  Unable to hold and term incontinence    Pressure flow study (Emptying):  Contractility: adequate  Maximum detrusor pressure (Pdet) : 62     H2O  Bladder outlet obstruction: no/equivocal  Coordination of sphincter: yes  Satisfactory emptying: no  PVR = 100cc      EMG: surface electrodes throughout the study : Activity during filling, increased with voiding    Following study:  Did study reproduce primary filling/storage symptoms?yes  Did study reproduce primary emptying symptoms?uncertain  Did study reproduce usual urination/voiding pattern at home? uncertain ; pt does not endorse signif leaking    Antibiotics administered  Y    Impression:   Small capacity, early sensation  Bladder stable initially, then positive DO at 150 to 200 cc, positive leak, unable to hold and terminal incontinence  Cysto with Dr. Carroll Sage  Has failed medical management, discussed third line treatments, namely Botox with patient

## 2020-01-26 NOTE — Telephone Encounter (Signed)
Pt would like a call back as soon as possible as he has follow up questions after today's appt. He can be reached at [phone].    Pt has concerns on:     On medications  Not control of bladder  No discharge instructions  Blood in urine

## 2020-01-26 NOTE — Telephone Encounter (Signed)
Discussed that the cysto has caused irritation of his bladder. Offered oxybutynin- which pt would like called in.  Patient will try tolterodine (has on hand)- if this does not work- sent Rx for oxybutynin.    Educated patient on side effects of this medication.

## 2020-02-07 ENCOUNTER — Telehealth (INDEPENDENT_AMBULATORY_CARE_PROVIDER_SITE_OTHER): Payer: Self-pay | Admitting: Urology

## 2020-02-07 NOTE — Telephone Encounter (Signed)
Dr Carroll Sage,   Should pt wait until Monday to come leave urine specimen, last dose of abx is tomorrow. Please advise of any recommendations.   TY        Returned call to pt who states after UDS/Cysto he was having urinary frequency and burning. He contacted PCP who put him on Cipro 500mg  BID x 10 days. No urine specimen was sent as pt was out of town at the time. He is on Day 9 of the course. Pt states burning has improved but he has pain in scrotum after urination "like someone has kicked me." Pt asking for additional recommendations.

## 2020-02-07 NOTE — Telephone Encounter (Signed)
Pt states they have been when urinating following UDS.

## 2020-02-07 NOTE — Telephone Encounter (Signed)
I think it makes sense to wait until Monday

## 2020-02-07 NOTE — Telephone Encounter (Signed)
Returned call to pt to inform him of recommendation from Dr Carroll Sage, pt stated he has an appt with PCP Monday morning. Offered him an appt here with NP. Pt agreed and had no further questions or concerns at this time.

## 2020-02-12 ENCOUNTER — Ambulatory Visit (FREE_STANDING_LABORATORY_FACILITY): Payer: BLUE CROSS/BLUE SHIELD | Admitting: Nurse Practitioner

## 2020-02-12 ENCOUNTER — Encounter (INDEPENDENT_AMBULATORY_CARE_PROVIDER_SITE_OTHER): Payer: Self-pay | Admitting: Nurse Practitioner

## 2020-02-12 VITALS — BP 136/79 | Ht 69.0 in | Wt 173.0 lb

## 2020-02-12 DIAGNOSIS — R102 Pelvic and perineal pain: Secondary | ICD-10-CM

## 2020-02-12 DIAGNOSIS — G35 Multiple sclerosis: Secondary | ICD-10-CM

## 2020-02-12 DIAGNOSIS — R350 Frequency of micturition: Secondary | ICD-10-CM

## 2020-02-12 DIAGNOSIS — R32 Unspecified urinary incontinence: Secondary | ICD-10-CM

## 2020-02-12 LAB — URINALYSIS POC
Blood, UA POCT: NEGATIVE
POCT Urine Bilirubin: NEGATIVE
POCT Urine Glucose: NEGATIVE mg/dL
POCT Urine Ketones: NEGATIVE mg/dL
POCT Urine Nitrites: NEGATIVE
POCT Urine Urobilibogen: 0.2 mg/dL (ref 0.0–1.0)
POCT Urine pH: 5.5 (ref 5.0–8.0)
Protein, UR POCT: NEGATIVE mg/dL
Urine Specific Gravity POC: >=1.03 (ref 1.001–1.035)
Urine leukocyte Esterase, POCT: NEGATIVE

## 2020-02-12 LAB — BLADDER SCAN: Urine Volume:: 15

## 2020-02-12 MED ORDER — SULFAMETHOXAZOLE-TRIMETHOPRIM 800-160 MG PO TABS
1.0000 | ORAL_TABLET | Freq: Two times a day (BID) | ORAL | 0 refills | Status: DC
Start: 2020-02-12 — End: 2020-02-19

## 2020-02-12 NOTE — Patient Instructions (Addendum)
-  Start Bactrim today. Will take 2x daily. Stop if you develop a rash or skin irritatuion. Take probiotics daily.    -Take Ibuprofen 600mg  (3 tablets) twice daily for 2-3 weeks, with food. (Aleve 1-2 tablets twice daily is also an option.) Then can then decrease and use as needed.      -If constipated, please increase fiber in diet and use OTC products such as Metamucil or Colace stool softener    -Increase water intake to goal of 8 glasses daily and try to limit dietary irritants such as excess caffeine, spicy food, acidic foods, carbonation, alcohol, smoking    - Recommend daily walking, pelvic and low back stretches, yoga, taking breaks from sitting    -Daily warm sitz bath/warm compress. IF heat seems to irritate worse, then try ice.    -Use a padded or foam seat cushion for periods of prolonged sitting. You may consider the purchase of a specialized prostate seat cushion (search on Amazon) and use as often as you can.    -Please schedule ultrasound of kidneys and bladder to be done prior to next visit.  Call [facility] at [phone] or Verne Carrow [phone] to schedule radiology test.    -Urine culture sent today. Takes 2-3 day to get results.    -Follow up with Dr. Carroll Sage  on 03/13/20

## 2020-02-12 NOTE — Progress Notes (Signed)
Subjective:      Patient ID: [name] Young is a 69 y.o. male     Chief Complaint:    1. Urinary incontinence  2. Perineal pain  3. Dysuria  4. OAB  5. MS    This is a very pleasant 69 year old male who presents today for dysuria, perineal pain, and urinary frequency, andurinary incontinence.  Established patient of Dr. Carroll Sage. Reports that after his cystoscopy on 01/26/20 he had an episode of incontinence while leaving to go home. He was unable to hold his bladder after the UDS + cystoscopy, given 1 dose of Cipro after cystoscopy. Tried oxybutynin 5mg  x2 doses, incontinence gradually resolved. Prescribed 10 day course of Cipro by PCP after cystoscopy for the dysuria and suspected UTI, completed on 02/08/20. Had dysuria that has somewhat improved after taking course of Cipro, but has persistent perineal pain. Pain was initially a 7/10 and  is about 3-3.5/10 now, some relief with Ibuprofen. Has discomfort at rest, some relief with urination.Gross hematuria observed for about 3 days and had urinary frequency (about every hour).  No fevers/chills. No abdominal or flank pain.       Stream seems weaker, gets up 2-3 x night  10x during the day.       Has tried myrbetriq and detrol, no relief.      Last BM, regular this morning    PVR: 15cc          The following portions of the patient's history were reviewed and updated as appropriate: allergies, current medications, past family history, past medical history, past social history, past surgical history and problem list.    Review of Systems  Systems reviewed per the HPI and below:     History obtained from the patient     General ROS: no fevers, or chills     Gastrointestinal ROS: no abdominal pain, change in bowel habits     Musculoskeletal ROS:  no swelling to lower extremities, no back pain     Objective:   BP 136/79   Ht 1.753 m (5\' 9" )   Wt 78.5 kg (173 lb)   BMI 25.55 kg/m    vital signs reviewed  Physical Exam   Constitutional: Pt is oriented to person, place, and  time and well-developed, well-nourished, and in no distress.   Pulmonary/Chest: Effort normal.   Abdominal: Soft. Pt exhibits no distension and no mass. There is no tenderness. There is no rebound and no guarding.   GU: No suprapubic discomfort or CVA tenderness.        Lab Review   Reviewed results as listed below. Discussed findings with patient.     Urine analysis shows   Component      Latest Ref Rng & Units 02/12/2020   POCT Urine Color      Yellow Yellow   POCT Urine Clarity      Clear - Hazy Clear   POCT Urine pH      5.0 - 8.0 5.5   Urine leukocyte Esterase, POCT      Negative Negative   POCT Urine Nitrites      Negative Negative   Protein, UR POCT      Negative mg/dL Negative   POCT Urine Glucose      Negative mg/dL Negative   POCT Urine Ketones      Negative mg/dL Negative   POCT Urine Urobilibogen      0.0 - 1.0 mg/dL 0.2   POCT Urine Bilirubin  Negative Negative   Blood, UA POCT      Negative Negative   Urine Specific Gravity POC      1.001 - 1.035 >=1.030     Urine cultures:  05/20/11: No growth        Radiology Review   none    Assessment:     1. Urinary frequency    2. Urinary incontinence, unspecified type    3. Male perineal pain    4. MS (multiple sclerosis)    -Urine cx to r/o infection  -Bactrim, Ditropan XL sent to pharmacy  -Ordered RBUS  -PVR  -Pain control  -Discuss botox with Dr. Carroll Sage at next visit      Management and care for this patient was reviewed with the supervising physician       Plan:   Patient Instructions   -Start Bactrim today. Will take 2x daily. Stop if you develop a rash or skin irritatuion. Take probiotics daily.    -Take Ibuprofen 600mg  (3 tablets) twice daily for 2-3 weeks, with food. (Aleve 1-2 tablets twice daily is also an option.) Then can then decrease and use as needed.      -If constipated, please increase fiber in diet and use OTC products such as Metamucil or Colace stool softener    -Increase water intake to goal of 8 glasses daily and try to limit dietary  irritants such as excess caffeine, spicy food, acidic foods, carbonation, alcohol, smoking    - Recommend daily walking, pelvic and low back stretches, yoga, taking breaks from sitting    -Daily warm sitz bath/warm compress. IF heat seems to irritate worse, then try ice.    -Use a padded or foam seat cushion for periods of prolonged sitting. You may consider the purchase of a specialized prostate seat cushion (search on [employer]) and use as often as you can.    -Please schedule ultrasound of kidneys and bladder to be done prior to next visit.  Call [facility] at [phone] or [place] Carrow [phone] to schedule radiology test.    -Urine culture sent today. Takes 2-3 day to get results.    -Follow up with Dr. Carroll Sage  on 03/13/20          Orders  Orders Placed This Encounter   Procedures   . Urine culture     Order Specific Question:   Release to patient     Answer:   Immediate   . US Renal Kidney Bladder Complete     Standing Status:   Future     Standing Expiration Date:   02/11/2021     Order Specific Question:   Clinical info for radiologist     Answer:   possible prostatitis     Order Specific Question:   Release to patient     Answer:   Immediate   . Urinalysis POC   . Bladder scan

## 2020-02-14 ENCOUNTER — Telehealth (INDEPENDENT_AMBULATORY_CARE_PROVIDER_SITE_OTHER): Payer: Self-pay | Admitting: Nurse Practitioner

## 2020-02-14 ENCOUNTER — Other Ambulatory Visit (INDEPENDENT_AMBULATORY_CARE_PROVIDER_SITE_OTHER): Payer: Self-pay | Admitting: Nurse Practitioner

## 2020-02-14 MED ORDER — OXYBUTYNIN CHLORIDE ER 10 MG PO TB24
10.00 mg | ORAL_TABLET | Freq: Every day | ORAL | 3 refills | Status: DC
Start: 2020-02-14 — End: 2020-02-14

## 2020-02-14 MED ORDER — OXYBUTYNIN CHLORIDE ER 10 MG PO TB24
10.0000 mg | ORAL_TABLET | Freq: Every day | ORAL | 3 refills | Status: DC
Start: 2020-02-14 — End: 2020-03-26

## 2020-02-14 NOTE — Progress Notes (Signed)
Night 3-4x, still has urinary frequency. Urinary frequency very bothersome.    Stop Bactrim as culture is negative.    Sent oxybutynin XL to pharmacy.    Has scheduled RBUS.

## 2020-02-14 NOTE — Telephone Encounter (Signed)
[name] Young,   Pt asking for urine tests results.   TY

## 2020-02-14 NOTE — Telephone Encounter (Signed)
Pt is calling regarding his lab  Results.   TY

## 2020-02-19 ENCOUNTER — Encounter (INDEPENDENT_AMBULATORY_CARE_PROVIDER_SITE_OTHER): Payer: Self-pay | Admitting: Nurse Practitioner

## 2020-02-19 DIAGNOSIS — G35 Multiple sclerosis: Secondary | ICD-10-CM | POA: Insufficient documentation

## 2020-02-21 ENCOUNTER — Other Ambulatory Visit (INDEPENDENT_AMBULATORY_CARE_PROVIDER_SITE_OTHER): Payer: Self-pay | Admitting: Nurse Practitioner

## 2020-02-21 ENCOUNTER — Other Ambulatory Visit: Payer: Self-pay | Admitting: Nurse Practitioner

## 2020-02-21 DIAGNOSIS — R102 Pelvic and perineal pain: Secondary | ICD-10-CM

## 2020-02-21 DIAGNOSIS — R32 Unspecified urinary incontinence: Secondary | ICD-10-CM

## 2020-02-21 DIAGNOSIS — R350 Frequency of micturition: Secondary | ICD-10-CM

## 2020-03-13 ENCOUNTER — Encounter (INDEPENDENT_AMBULATORY_CARE_PROVIDER_SITE_OTHER): Payer: Self-pay | Admitting: Urology

## 2020-03-13 ENCOUNTER — Ambulatory Visit (INDEPENDENT_AMBULATORY_CARE_PROVIDER_SITE_OTHER): Payer: BLUE CROSS/BLUE SHIELD | Admitting: Urology

## 2020-03-13 VITALS — BP 148/86 | HR 74 | Ht 69.0 in | Wt 173.0 lb

## 2020-03-13 DIAGNOSIS — G35 Multiple sclerosis: Secondary | ICD-10-CM

## 2020-03-13 DIAGNOSIS — R351 Nocturia: Secondary | ICD-10-CM

## 2020-03-13 DIAGNOSIS — N3281 Overactive bladder: Secondary | ICD-10-CM | POA: Insufficient documentation

## 2020-03-13 NOTE — Patient Instructions (Signed)
Percutaneous Tibial Nerve Stimulation (PTNS) is a minimally invasive treatment for overactive bladder (OAB) symptoms. While many options are available for the treatment of OAB, including behavioral modification, Pelvic Floor Muscle Rehabilitation (PFMR) and medication, not all patients have success with these. PTNS works by gentle electrical stimulation of the nerves of the sacral nerve plexus to modify the bladder’s activity, sometimes referred to as neuromodulation. The tibial nerve, located in the lower leg, can be accessed with a sensor placed through the skin, the impulses then travel along the tibial nerve and to the sacral nerve plexus.     Indications  Over active bladder (OAB)   Urge urinary incontinence   Inability to tolerate, allergy, or non-response to medications for overactive bladder   Procedure    After your physician determines that you may benefit from PTNS, you return for an initial evaluation with our nurse practitioner who specializes in urinary incontinence. A small needle electrode is inserted adjacent to the tibial nerve in your lower leg and connected to the battery-powered stimulator. These gentle impulses travel up the tibial nerve to the sacral nerve plexus to modify the bladder’s activity. Each treatment last about thirty minutes, and an initial series of twelve treatments are scheduled, each about a week apart. The entire procedure is carried out in the comfort of our office, and following the initial series of treatments you are evaluated by your doctor to assess your response to treatment.     Side effects  Discomfort at the stimulation site   Minimal bleeding at the point of electrode insertion   Advantages  Treatment for incontinence or over active bladder not requiring surgery or ongoing medication   Quick and efficient office treatment                Bladder confidence and control without drugs or surgery  The Urgent® PC Neuromodulation System, is a non-drug, office-based  treatment for Overactive Bladder and associated symptoms of urgency, frequency and urge incontinence.    • Used in doctors’ offices since 2003.  • Delivers PTNS (percutaneous tibial nerve stimulation) and is effective for men and women.  • Patient sits comfortably with a slim needle electrode temporarily placed near the ankle.  • A device is attached to the needle and delivers mild electrical impulses which travel up the nerves in the leg to the nerves that control the bladder.  • Patients usually feel a sensation in the foot or leg during treatment.  • 30-minute weekly sessions for 12 weeks. If you get better with Urgent PC, you may have to come back for treatments to stay better.      How does Urgent PC work?  Urgent PC produces mild impulses which:    1. Enter through needle electrode placed near ankle  2. Travel up tibial nerve in the leg  3. Reach nerves responsible for bladder control                       Will Urgent PC work for me?  • Many studies show up to 80% of patients get better with Urgent PC treatment.1-4  • Most Urgent PC patients go to the bathroom less and have less accidents.1-4  • Urgent PC may work even if other therapies haven’t worked for you.2-4  • May be used alone or with other OAB treatments.    How soon will it work for me?  • It will probably take about 6 weeks for symptoms to   change, but it is different for each person.  • In one study, patients got better between 2-12 weeks. For 1 out of 5 it took 8 weeks before they got better.3  • It is important to have 12 treatments before you and your doctor decide if Urgent PC is right for you.    What do patients say about Urgent PC?    I enjoy life again  “It got to the point where I had no social life; I was too embarrassed to go anywhere. Now, the treatment has gotten me to where I can go and see friends and I’m getting more sleep. I have a different lifestyle all together and I’m enjoying life again.” - [name] Young*    Now I sleep  “[Urgent PC]  has been a miracle for me. My nighttime urges have been dramatically reduced and I’m now well-rested. I am free from the constant worry about accidents.” - [name] Young*    Forget to carry pads  “Prior to [Urgent PC], I needed to carry pads with me so that I would not have accidents. With this therapy I pretty much forget that I have to carry pads with me. I’ve been able to take up a pretty regular lifestyle and not have to worry about leaking or an embarrassing situation.” - [name] Young*     *Results may vary, not all patients obtain the same outcomes.    Ask your doctor to learn more about Urgent PC and neuromodulation.    CONTRAINDICATIONS: Treatment with Urgent PC is contraindicated for patients with pacemakers or implantable defibrillators, patients prone to excessive bleeding, patients with nerve damage that could impact either percutaneous tibial nerve or pelvic floor function, or patients who are pregnant or planning to become pregnant during the duration of the treatment. PRECAUTIONS: Exercise caution for patients with heart problems related to pacing. Most patients don’t experience side-effects. If side-effects occur, they are typically temporary and include mild pain and skin inflammation near the treatment site. CAUTION: Federal law ([place]) restricts this device to sale by or on the order of a physician. For complete instructions for use, storage, warnings, indications, contraindications, precautions, adverse reactions and disclaimer of warranties, please refer to the insert accompanying each Urgent PC product or online at www.uroplasty.com.

## 2020-03-13 NOTE — Progress Notes (Signed)
Subjective:      Patient ID: [name] Young is a 69 y.o. male     Chief Complaint:  1. Nocturia    2. MS (multiple sclerosis)    3. Overactive bladder        This is a pleasant 47M.  Visit today is urinary frequency and nocturia.  He was referred by Dr. Mayra Neer for this issue. He reports voids every 2 hours and nocturia x3-4.  There is no significant urgency or UUI.   This has been ongoing for about 10 years and slightly worsening.  He feels that his stream is normal and reportedly had multiple office flow studies which have been normal.  He has tried multiple medications including Detrol, Myrbetriq and Flomax.  He felt that 4 mg of Detrol actually made his symptoms worse.  He is currently on daily Cialis.  He felt that after initially starting the Cialis symptoms were better but this was not sustained.  He has a history of MS.  He drinks 40 to 50 ounces of fluid daily and symptoms do not seem to be related to fluid intake.  Reports normal PSA with PCP.      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  He reports that after the cystoscopy he had 24 hours of complete incontinence followed by 3 weeks of perineal pain.  He was treated with a course of antibiotics for presumptive infection.  However, he had urinalysis in our office and culture which were negative.  He had a renal ultrasound which was unremarkable.   He has tried oxybutynin without significant change in his nocturia, x3-4.  His PVRs have been normal.    The following portions of the patient's history were reviewed and updated as appropriate: allergies, current medications, past family history, past medical history, past social history, past surgical history and problem list.    Review of Systems  Systems reviewed per the HPI and below:     History obtained from the patient     General ROS: no fevers,  or chills     Gastrointestinal ROS: no abdominal pain, change in bowel habits     Musculoskeletal ROS:  no swelling to lower extremities, no back pain     Objective:     Vitals:    03/13/20 1459   BP: 148/86   Pulse: 74       Physical Exam   Constitutional:  Well-developed, well-nourished, and in no distress.   Pulmonary/Chest: Effort normal.   Neurological: Pt is alert and oriented to person, place, and time.    Psychiatric: Mood, memory, affect and judgment normal.       Lab Review   Reviewed results as listed below. Discussed findings with patient.     Urine analysis shows   Results     ** No results found for the last 24 hours. **              Radiology Review   Reviewed results as listed below. Discussed results with the patient and answered questions to the best of my ability.   RUS as above       Assessment:       Overactive bladder   Nocturia   H/o of MS    We had a long conversation regarding management options for refractory overactive bladder.  We discussed PTNS, Botox and InterStim  in great detail.  Given the difficulty that he had with cystoscopy I am hesitant to proceed with Botox.  We discussed that InterStim is not FDA approved for neurogenic bladder.  He would like to proceed with PTNS.  I think this is a good option for him.  If he fails this we can move on to one of the other options.  We will plan to stop the oxybutynin as this has not been effective for him.     Plan:   Patient Instructions      Percutaneous Tibial Nerve Stimulation (PTNS) is a minimally invasive treatment for overactive bladder (OAB) symptoms. While many options are available for the treatment of OAB, including behavioral modification, Pelvic Floor Muscle Rehabilitation (PFMR) and medication, not all patients have success with these. PTNS works by gentle electrical stimulation of the nerves of the sacral nerve plexus to modify the bladder's activity, sometimes referred to as neuromodulation. The tibial nerve, located in the  lower leg, can be accessed with a sensor placed through the skin, the impulses then travel along the tibial nerve and to the sacral nerve plexus.     Indications  Over active bladder (OAB)   Urge urinary incontinence   Inability to tolerate, allergy, or non-response to medications for overactive bladder   Procedure    After your physician determines that you may benefit from PTNS, you return for an initial evaluation with our nurse practitioner who specializes in urinary incontinence. A small needle electrode is inserted adjacent to the tibial nerve in your lower leg and connected to the battery-powered stimulator. These gentle impulses travel up the tibial nerve to the sacral nerve plexus to modify the bladder's activity. Each treatment last about thirty minutes, and an initial series of twelve treatments are scheduled, each about a week apart. The entire procedure is carried out in the comfort of our office, and following the initial series of treatments you are evaluated by your doctor to assess your response to treatment.     Side effects  Discomfort at the stimulation site   Minimal bleeding at the point of electrode insertion   Advantages  Treatment for incontinence or over active bladder not requiring surgery or ongoing medication   Quick and efficient office treatment                Bladder confidence and control without drugs or surgery  The Urgent PC Neuromodulation System, is a non-drug, office-based treatment for Overactive Bladder and associated symptoms of urgency, frequency and urge incontinence.    . Used in doctors' offices since 2003.  . Delivers PTNS (percutaneous tibial nerve stimulation) and is effective for men and women.  . Patient sits comfortably with a slim needle electrode temporarily placed near the ankle.  . A device is attached to the needle and delivers mild electrical impulses which travel up the nerves in the leg to the nerves that control the bladder.  . Patients usually feel a  sensation in the foot or leg during treatment.  Marland Kitchen 30-minute weekly sessions for 12 weeks. If you get better with Urgent PC, you may have to come back for treatments to stay better.      How does Urgent PC work?  Urgent PC produces mild impulses which:    1. Enter through needle electrode placed near ankle  2. Travel up tibial nerve in the leg  3. Reach nerves responsible for bladder control  Will Urgent PC work for me?  . Many studies show up to 80% of patients get better with Urgent PC treatment.1-4  . Most Urgent PC patients go to the bathroom less and have less accidents.1-4  . Urgent PC may work even if other therapies haven't worked for [employer]  . May be used alone or with other OAB treatments.    How soon will it work for me?  . It will probably take about 6 weeks for symptoms to change, but it is different for each person.  . In one study, patients got better between 2-12 weeks. For 1 out of 5 it took 8 weeks before they got better.3  . It is important to have 12 treatments before you and your doctor decide if Urgent PC is right for you.    What do patients say about Urgent PC?    I enjoy life again  "It got to the point where I had no social life; I was too embarrassed to go anywhere. Now, the treatment has gotten me to where I can go and see friends and I'm getting more sleep. I have a different lifestyle all together and I'm enjoying life again." - [name]    Now I sleep  "[Urgent PC] has been a miracle for me. My nighttime urges have been dramatically reduced and I'm now well-rested. I am free from the constant worry about accidents." - [name]    Forget to carry pads  "Prior to [facility]], I needed to carry pads with me so that I would not have accidents. With this therapy I pretty much forget that I have to carry pads with me. I've been able to take up a pretty regular lifestyle and not have to worry about leaking or an embarrassing situation." - Aurea Graff*     *Results may vary, not  all patients obtain the same outcomes.    Ask your doctor to learn more about Urgent PC and neuromodulation.    CONTRAINDICATIONS: Treatment with Urgent PC is contraindicated for patients with pacemakers or implantable defibrillators, patients prone to excessive bleeding, patients with nerve damage that could impact either percutaneous tibial nerve or pelvic floor function, or patients who are pregnant or planning to become pregnant during the duration of the treatment. PRECAUTIONS: Exercise caution for patients with heart problems related to pacing. Most patients don't experience side-effects. If side-effects occur, they are typically temporary and include mild pain and skin inflammation near the treatment site. CAUTION: Freight [employer] ([place]) restricts this device to sale by or on the order of a physician. For complete instructions for use, storage, warnings, indications, contraindications, precautions, adverse reactions and disclaimer of warranties, please refer to the insert accompanying each Urgent PC product or online at https://stevens.biz/.      [phone] (40-54 minutes)  I spent 40 total  minutes for the following activities:  Interviewing/examining the patient  Reviewing relevant notes  Reviewing/interpreting/ordering tests and medications  Counseling and educating the patient/family/caregiver with care coordination  Documenting/charting clinical information in EPIC.          Orders  No orders of the defined types were placed in this encounter.

## 2020-03-18 ENCOUNTER — Encounter: Payer: Self-pay | Admitting: Colon & Rectal Surgery

## 2020-03-19 ENCOUNTER — Encounter (INDEPENDENT_AMBULATORY_CARE_PROVIDER_SITE_OTHER): Payer: Self-pay | Admitting: Urology

## 2020-03-19 ENCOUNTER — Encounter (INDEPENDENT_AMBULATORY_CARE_PROVIDER_SITE_OTHER): Payer: Self-pay | Admitting: Nurse Practitioner

## 2020-03-26 ENCOUNTER — Encounter (INDEPENDENT_AMBULATORY_CARE_PROVIDER_SITE_OTHER): Payer: Self-pay | Admitting: Urology

## 2020-03-26 ENCOUNTER — Ambulatory Visit (INDEPENDENT_AMBULATORY_CARE_PROVIDER_SITE_OTHER): Payer: BLUE CROSS/BLUE SHIELD | Admitting: Urology

## 2020-03-26 DIAGNOSIS — R351 Nocturia: Secondary | ICD-10-CM

## 2020-03-26 NOTE — Procedures (Signed)
Dx: urinary frequency and nocturia      Urgent PC Procedure Note # 1/12    After procedural consent was obtained, the right leg was elevated and positioned with padding to expose the medial malleolus and posterior tibial nerve tract.  The area three finger breadths cephalad and one finger breadth posterior to the inferior aspect of the medial malleolus was marked and prepped with an alcohol swab.  A 34 gauge needle was inserted at a 60 degree angle and placed near the posterior tibial nerve.  The Urgent PC stimulator was then used in test mode for intensity of treatment.    Once the intensity level had been established, with confirmed movement of the digits of the left foot, the patient underwent therapy at a level 5 for 30 minutes.    The procedure was completed without complication and the patient tolerated this well.    Plan  Follow-up in one week

## 2020-04-02 ENCOUNTER — Encounter (INDEPENDENT_AMBULATORY_CARE_PROVIDER_SITE_OTHER): Payer: Self-pay | Admitting: Urology

## 2020-04-02 ENCOUNTER — Ambulatory Visit (INDEPENDENT_AMBULATORY_CARE_PROVIDER_SITE_OTHER): Payer: BLUE CROSS/BLUE SHIELD | Admitting: Urology

## 2020-04-02 DIAGNOSIS — R3911 Hesitancy of micturition: Secondary | ICD-10-CM

## 2020-04-02 NOTE — Procedures (Signed)
Dx: urinary frequency, nocturia     Urgent PC Procedure Note # 2/12    After procedural consent was obtained, the right leg was elevated and positioned with padding to expose the medial malleolus and posterior tibial nerve tract.  The area three finger breadths cephalad and one finger breadth posterior to the inferior aspect of the medial malleolus was marked and prepped with an alcohol swab.  A 34 gauge needle was inserted at a 60 degree angle and placed near the posterior tibial nerve.  The Urgent PC stimulator was then used in test mode for intensity of treatment.    Once the intensity level had been established, with confirmed movement of the digits of the left foot, the patient underwent therapy at a level 6 for 30 minutes.    The procedure was completed without complication and the patient tolerated this well.    Plan  Follow-up in one week

## 2020-04-09 ENCOUNTER — Encounter (INDEPENDENT_AMBULATORY_CARE_PROVIDER_SITE_OTHER): Payer: Self-pay | Admitting: Urology

## 2020-04-09 ENCOUNTER — Ambulatory Visit (INDEPENDENT_AMBULATORY_CARE_PROVIDER_SITE_OTHER): Payer: BLUE CROSS/BLUE SHIELD | Admitting: Urology

## 2020-04-09 DIAGNOSIS — R351 Nocturia: Secondary | ICD-10-CM

## 2020-04-09 NOTE — Procedures (Signed)
Dx: nocturia    Urgent PC Procedure Note # 3/12    After procedural consent was obtained, the right leg was elevated and positioned with padding to expose the medial malleolus and posterior tibial nerve tract.  The area three finger breadths cephalad and one finger breadth posterior to the inferior aspect of the medial malleolus was marked and prepped with an alcohol swab.  A 34 gauge needle was inserted at a 60 degree angle and placed near the posterior tibial nerve.  The Urgent PC stimulator was then used in test mode for intensity of treatment.    Once the intensity level had been established, with confirmed movement of the digits of the left foot, the patient underwent therapy at a level 3 for 30 minutes.    The procedure was completed without complication and the patient tolerated this well.    Plan  Follow-up in one week

## 2020-04-16 ENCOUNTER — Encounter (INDEPENDENT_AMBULATORY_CARE_PROVIDER_SITE_OTHER): Payer: Self-pay | Admitting: Urology

## 2020-04-16 ENCOUNTER — Ambulatory Visit (INDEPENDENT_AMBULATORY_CARE_PROVIDER_SITE_OTHER): Payer: BLUE CROSS/BLUE SHIELD | Admitting: Urology

## 2020-04-16 DIAGNOSIS — R351 Nocturia: Secondary | ICD-10-CM

## 2020-04-16 NOTE — Procedures (Signed)
Dx: Nocturia     Urgent PC Procedure Note # 4/12    After procedural consent was obtained, the right leg was elevated and positioned with padding to expose the medial malleolus and posterior tibial nerve tract.  The area three finger breadths cephalad and one finger breadth posterior to the inferior aspect of the medial malleolus was marked and prepped with an alcohol swab.  A 34 gauge needle was inserted at a 60 degree angle and placed near the posterior tibial nerve.  The Urgent PC stimulator was then used in test mode for intensity of treatment.    Once the intensity level had been established, with confirmed movement of the digits of the left foot, the patient underwent therapy at a level 9 for 30 minutes.    The procedure was completed without complication and the patient tolerated this well.    Plan  Follow-up in one week

## 2020-04-23 ENCOUNTER — Ambulatory Visit (INDEPENDENT_AMBULATORY_CARE_PROVIDER_SITE_OTHER): Payer: BLUE CROSS/BLUE SHIELD | Admitting: Urology

## 2020-04-23 ENCOUNTER — Encounter (INDEPENDENT_AMBULATORY_CARE_PROVIDER_SITE_OTHER): Payer: Self-pay | Admitting: Urology

## 2020-04-23 DIAGNOSIS — R351 Nocturia: Secondary | ICD-10-CM

## 2020-04-23 NOTE — Procedures (Signed)
Dx: nocturia    Urgent PC Procedure Note # 5/10    After procedural consent was obtained, the right leg was elevated and positioned with padding to expose the medial malleolus and posterior tibial nerve tract.  The area three finger breadths cephalad and one finger breadth posterior to the inferior aspect of the medial malleolus was marked and prepped with an alcohol swab.  A 34 gauge needle was inserted at a 60 degree angle and placed near the posterior tibial nerve.  The Urgent PC stimulator was then used in test mode for intensity of treatment.    Once the intensity level had been established, with confirmed movement of the digits of the right foot, the patient underwent therapy at a level 4 for 30 minutes.    The procedure was completed without complication and the patient tolerated this well.    Plan  Follow-up in one week

## 2020-04-30 ENCOUNTER — Encounter (INDEPENDENT_AMBULATORY_CARE_PROVIDER_SITE_OTHER): Payer: Self-pay | Admitting: Urology

## 2020-04-30 ENCOUNTER — Ambulatory Visit (INDEPENDENT_AMBULATORY_CARE_PROVIDER_SITE_OTHER): Payer: BLUE CROSS/BLUE SHIELD | Admitting: Urology

## 2020-04-30 DIAGNOSIS — R351 Nocturia: Secondary | ICD-10-CM

## 2020-04-30 NOTE — Procedures (Signed)
Dx: nocturia     Urgent PC Procedure Note # 6/10    After procedural consent was obtained, the right leg was elevated and positioned with padding to expose the medial malleolus and posterior tibial nerve tract.  The area three finger breadths cephalad and one finger breadth posterior to the inferior aspect of the medial malleolus was marked and prepped with an alcohol swab.  A 34 gauge needle was inserted at a 60 degree angle and placed near the posterior tibial nerve.  The Urgent PC stimulator was then used in test mode for intensity of treatment.    Once the intensity level had been established, with confirmed movement of the digits of the right foot, the patient underwent therapy at a level 3 for 30 minutes.    The procedure was completed without complication and the patient tolerated this well.    Plan  Improved nocturia to 2x from 3x  Follow-up in one week

## 2020-05-07 ENCOUNTER — Ambulatory Visit (INDEPENDENT_AMBULATORY_CARE_PROVIDER_SITE_OTHER): Payer: BLUE CROSS/BLUE SHIELD | Admitting: Urology

## 2020-05-07 ENCOUNTER — Encounter (INDEPENDENT_AMBULATORY_CARE_PROVIDER_SITE_OTHER): Payer: Self-pay | Admitting: Urology

## 2020-05-07 DIAGNOSIS — R351 Nocturia: Secondary | ICD-10-CM

## 2020-05-07 NOTE — Progress Notes (Signed)
Dx: nocturia    Urgent PC Procedure Note # 7/10    After procedural consent was obtained, the right leg was elevated and positioned with padding to expose the medial malleolus and posterior tibial nerve tract.  The area three finger breadths cephalad and one finger breadth posterior to the inferior aspect of the medial malleolus was marked and prepped with an alcohol swab.  A 34 gauge needle was inserted at a 60 degree angle and placed near the posterior tibial nerve.  The Urgent PC stimulator was then used in test mode for intensity of treatment.    Once the intensity level had been established, with confirmed movement of the digits of the right foot, the patient underwent therapy at a level 7 for 30 minutes.    The procedure was completed without complication and the patient tolerated this well.    Plan  Follow-up in one week  Back up to 3x nightly

## 2020-05-14 ENCOUNTER — Encounter (INDEPENDENT_AMBULATORY_CARE_PROVIDER_SITE_OTHER): Payer: Self-pay | Admitting: Urology

## 2020-05-14 ENCOUNTER — Ambulatory Visit (INDEPENDENT_AMBULATORY_CARE_PROVIDER_SITE_OTHER): Payer: BLUE CROSS/BLUE SHIELD | Admitting: Urology

## 2020-05-14 DIAGNOSIS — R351 Nocturia: Secondary | ICD-10-CM

## 2020-05-14 NOTE — Procedures (Signed)
Dx: Nocturia     Urgent PC Procedure Note # 8/10    After procedural consent was obtained, the right leg was elevated and positioned with padding to expose the medial malleolus and posterior tibial nerve tract.  The area three finger breadths cephalad and one finger breadth posterior to the inferior aspect of the medial malleolus was marked and prepped with an alcohol swab.  A 34 gauge needle was inserted at a 60 degree angle and placed near the posterior tibial nerve.  The Urgent PC stimulator was then used in test mode for intensity of treatment.    Once the intensity level had been established, with confirmed movement of the digits of the left foot, the patient underwent therapy at a level 6 for 30 minutes.    The procedure was completed without complication and the patient tolerated this well.    Plan  Follow-up in one week  Improvement has not been sustained but would like to continue

## 2020-05-21 ENCOUNTER — Encounter (INDEPENDENT_AMBULATORY_CARE_PROVIDER_SITE_OTHER): Payer: Self-pay | Admitting: Nurse Practitioner

## 2020-05-21 ENCOUNTER — Ambulatory Visit (INDEPENDENT_AMBULATORY_CARE_PROVIDER_SITE_OTHER): Payer: BLUE CROSS/BLUE SHIELD | Admitting: Nurse Practitioner

## 2020-05-21 VITALS — BP 120/75 | HR 76 | Ht 69.0 in | Wt 175.0 lb

## 2020-05-21 DIAGNOSIS — R351 Nocturia: Secondary | ICD-10-CM

## 2020-05-21 NOTE — Patient Instructions (Signed)
Follow up in 1 week for PTNS #10/10

## 2020-05-21 NOTE — Procedures (Signed)
Urgent PC Procedure Note # 9/10  Diagnosis: nocturia    After procedural consent was obtained, the right leg was elevated and positioned with padding to expose the medial malleolus and posterior tibial nerve tract.  The area three finger breadths cephalad and one finger breadth posterior to the inferior aspect of the medial malleolus was marked and prepped with an alcohol swab.  A 34 gauge needle was inserted at a 60 degree angle and placed near the posterior tibial nerve.  The Urgent PC stimulator was then used in test mode for intensity of treatment.    Once the intensity level had been established, with confirmed movement of the digits of the right foot, the patient underwent therapy at a level 6 for 30 minutes.    The procedure was completed without complication and the patient tolerated this well.    Nocturia x3. He does not believe there has been improvement with PTNS, but would like to continue treatment.      Plan  Follow-up in one week for PTNS #10/10

## 2020-05-28 ENCOUNTER — Encounter (INDEPENDENT_AMBULATORY_CARE_PROVIDER_SITE_OTHER): Payer: Self-pay | Admitting: Urology

## 2020-05-28 ENCOUNTER — Ambulatory Visit (INDEPENDENT_AMBULATORY_CARE_PROVIDER_SITE_OTHER): Payer: BLUE CROSS/BLUE SHIELD | Admitting: Urology

## 2020-05-28 DIAGNOSIS — R351 Nocturia: Secondary | ICD-10-CM

## 2020-05-28 NOTE — Procedures (Addendum)
Dx: nocturia    Urgent PC Procedure Note # 10/12    After procedural consent was obtained, the right leg was elevated and positioned with padding to expose the medial malleolus and posterior tibial nerve tract.  The area three finger breadths cephalad and one finger breadth posterior to the inferior aspect of the medial malleolus was marked and prepped with an alcohol swab.  A 34 gauge needle was inserted at a 60 degree angle and placed near the posterior tibial nerve.  The Urgent PC stimulator was then used in test mode for intensity of treatment.    Once the intensity level had been established, with confirmed movement of the digits of the right foot, the patient underwent therapy at a level 9 for 30 minutes.    The procedure was completed without complication and the patient tolerated this well.    Plan  Follow-up in one week

## 2020-06-04 ENCOUNTER — Encounter (INDEPENDENT_AMBULATORY_CARE_PROVIDER_SITE_OTHER): Payer: Self-pay | Admitting: Urology

## 2020-06-04 ENCOUNTER — Ambulatory Visit (INDEPENDENT_AMBULATORY_CARE_PROVIDER_SITE_OTHER): Payer: BLUE CROSS/BLUE SHIELD | Admitting: Urology

## 2020-06-04 DIAGNOSIS — R351 Nocturia: Secondary | ICD-10-CM

## 2020-06-04 NOTE — Procedures (Signed)
Dx: nocturia    Urgent PC Procedure Note # 11/12    After procedural consent was obtained, the right leg was elevated and positioned with padding to expose the medial malleolus and posterior tibial nerve tract.  The area three finger breadths cephalad and one finger breadth posterior to the inferior aspect of the medial malleolus was marked and prepped with an alcohol swab.  A 34 gauge needle was inserted at a 60 degree angle and placed near the posterior tibial nerve.  The Urgent PC stimulator was then used in test mode for intensity of treatment.    Once the intensity level had been established, with confirmed movement of the digits of the right foot, the patient underwent therapy at a level 6 for 30 minutes.    The procedure was completed without complication and the patient tolerated this well.    Plan  Follow-up in one week  Reports continue nocturia but improved urgency

## 2020-06-11 ENCOUNTER — Ambulatory Visit (INDEPENDENT_AMBULATORY_CARE_PROVIDER_SITE_OTHER): Payer: BLUE CROSS/BLUE SHIELD | Admitting: Urology

## 2020-06-11 ENCOUNTER — Encounter (INDEPENDENT_AMBULATORY_CARE_PROVIDER_SITE_OTHER): Payer: Self-pay | Admitting: Urology

## 2020-06-11 DIAGNOSIS — R351 Nocturia: Secondary | ICD-10-CM

## 2020-06-11 NOTE — Procedures (Signed)
Dx: nocturia     Urgent PC Procedure Note # 12/12    After procedural consent was obtained, the right leg was elevated and positioned with padding to expose the medial malleolus and posterior tibial nerve tract.  The area three finger breadths cephalad and one finger breadth posterior to the inferior aspect of the medial malleolus was marked and prepped with an alcohol swab.  A 34 gauge needle was inserted at a 60 degree angle and placed near the posterior tibial nerve.  The Urgent PC stimulator was then used in test mode for intensity of treatment.    Once the intensity level had been established, with confirmed movement of the digits of the left foot, the patient underwent therapy at a level 5 for 30 minutes.    The procedure was completed without complication and the patient tolerated this well.    Plan  Reports about a 30% improvement in nocturia (down to 2 nightly).  Would like to continue with maintenance  Follow-up in one month

## 2020-06-18 ENCOUNTER — Ambulatory Visit (INDEPENDENT_AMBULATORY_CARE_PROVIDER_SITE_OTHER): Payer: BLUE CROSS/BLUE SHIELD | Admitting: Urology

## 2020-06-25 ENCOUNTER — Ambulatory Visit (INDEPENDENT_AMBULATORY_CARE_PROVIDER_SITE_OTHER): Payer: BLUE CROSS/BLUE SHIELD | Admitting: Urology

## 2020-07-02 ENCOUNTER — Ambulatory Visit (INDEPENDENT_AMBULATORY_CARE_PROVIDER_SITE_OTHER): Payer: BLUE CROSS/BLUE SHIELD | Admitting: Urology

## 2020-07-09 ENCOUNTER — Ambulatory Visit (INDEPENDENT_AMBULATORY_CARE_PROVIDER_SITE_OTHER): Payer: BLUE CROSS/BLUE SHIELD | Admitting: Urology

## 2020-07-10 ENCOUNTER — Encounter (INDEPENDENT_AMBULATORY_CARE_PROVIDER_SITE_OTHER): Payer: Self-pay

## 2020-07-17 ENCOUNTER — Ambulatory Visit (INDEPENDENT_AMBULATORY_CARE_PROVIDER_SITE_OTHER): Payer: BLUE CROSS/BLUE SHIELD | Admitting: Urology

## 2020-07-17 ENCOUNTER — Encounter (INDEPENDENT_AMBULATORY_CARE_PROVIDER_SITE_OTHER): Payer: Self-pay | Admitting: Urology

## 2020-07-17 DIAGNOSIS — R351 Nocturia: Secondary | ICD-10-CM

## 2020-07-17 NOTE — Procedures (Signed)
Dx: nocturia    Urgent PC Procedure Note # maintenance    After procedural consent was obtained, the right leg was elevated and positioned with padding to expose the medial malleolus and posterior tibial nerve tract.  The area three finger breadths cephalad and one finger breadth posterior to the inferior aspect of the medial malleolus was marked and prepped with an alcohol swab.  A 34 gauge needle was inserted at a 60 degree angle and placed near the posterior tibial nerve.  The Urgent PC stimulator was then used in test mode for intensity of treatment.    Once the intensity level had been established, with confirmed movement of the digits of the left foot, the patient underwent therapy at a level 5 for 30 minutes.    The procedure was completed without complication and the patient tolerated this well.    Plan  Reports worsening urgency over past week attributes to being due for session  Follow-up early March when returns from trip

## 2020-09-03 ENCOUNTER — Ambulatory Visit (INDEPENDENT_AMBULATORY_CARE_PROVIDER_SITE_OTHER): Payer: BLUE CROSS/BLUE SHIELD | Admitting: Urology

## 2020-09-03 ENCOUNTER — Encounter (INDEPENDENT_AMBULATORY_CARE_PROVIDER_SITE_OTHER): Payer: Self-pay | Admitting: Urology

## 2020-09-03 DIAGNOSIS — R3915 Urgency of urination: Secondary | ICD-10-CM

## 2020-09-03 NOTE — Procedures (Signed)
Dx: urinary urgency    Urgent PC Procedure Note # maintenance     After procedural consent was obtained, the right leg was elevated and positioned with padding to expose the medial malleolus and posterior tibial nerve tract.  The area three finger breadths cephalad and one finger breadth posterior to the inferior aspect of the medial malleolus was marked and prepped with an alcohol swab.  A 34 gauge needle was inserted at a 60 degree angle and placed near the posterior tibial nerve.  The Urgent PC stimulator was then used in test mode for intensity of treatment.    Once the intensity level had been established, with confirmed movement of the digits of the right foot, the patient underwent therapy at a level 5 for 30 minutes.    The procedure was completed without complication and the patient tolerated this well.    Plan  -Patient no longer feels that PTNS has been effective for him.  We will not plan to continue.  He will follow-up in 4 to 6 months

## 2020-09-30 ENCOUNTER — Emergency Department: Payer: BLUE CROSS/BLUE SHIELD

## 2020-09-30 ENCOUNTER — Emergency Department
Admission: EM | Admit: 2020-09-30 | Discharge: 2020-09-30 | Disposition: A | Discharge status: Home / Self Care | Payer: BLUE CROSS/BLUE SHIELD | Attending: Emergency Medicine | Admitting: Emergency Medicine

## 2020-09-30 DIAGNOSIS — W109XXA Fall (on) (from) unspecified stairs and steps, initial encounter: Secondary | ICD-10-CM | POA: Insufficient documentation

## 2020-09-30 DIAGNOSIS — S93401A Sprain of unspecified ligament of right ankle, initial encounter: Secondary | ICD-10-CM

## 2020-09-30 NOTE — ED Provider Notes (Signed)
[facility] EMERGENCY DEPARTMENT RESIDENT H&P       CLINICAL INFORMATION        HPI:      Chief Complaint: Ankle Pain  .    [name] Young is a 70 y.o. male who presents with ankle pain. Today around 11am he was walking down the stairs when he missed the last step and felt like he twisted his R ankle. He did not hit his head or hurt any other part of his body. He felt immediate moderate pain in the R ankle. He was able to bear weight immediately. Initially he thought his toes may have felt numb but they no longer feel that way. He then iced the ankle and the pain improved but is still present. The pain is most prominent on the medial and lateral aspects of the ankle and the posterior ankle.    Nursing (triage) note reviewed for the following pertinent information:  c/o rt ankle and foot pain and swelling due to falling one step. No LOC/hit head. Not on bloodthinner. No pain med taken, put ice.      ROS:      Review of Systems   Constitutional: Negative for fever.   HENT: Negative for sore throat.    Eyes: Negative for visual disturbance.   Respiratory: Negative for shortness of breath.    Cardiovascular: Negative for chest pain.   Gastrointestinal: Negative for abdominal pain.   Musculoskeletal: Positive for arthralgias.   Skin: Negative for rash and wound.   Neurological: Negative for dizziness, light-headedness and headaches.   Hematological: Does not bruise/bleed easily.         Physical Exam:      Pulse 69  BP 158/72  Resp 17  SpO2 99 %  Temp 98.1 F (36.7 C)    Physical Exam  Constitutional:       General: He is not in acute distress.     Appearance: He is not ill-appearing.   Cardiovascular:      Rate and Rhythm: Normal rate and regular rhythm.   Pulmonary:      Effort: No respiratory distress.      Breath sounds: No stridor.   Abdominal:      Palpations: Abdomen is soft.      Tenderness: There is no abdominal tenderness.   Musculoskeletal:      Comments: R ankle without swelling,  erythema, or ecchymosis. Foot warm and well perfused. Full ROM in plantar/dorsiflexion. 5/5 strength in plantar/dorsiflexion. Sensation intact. Pedal pulse 2+. Tenderness to palpation on medial and lateral malleolus    Neurological:      Mental Status: He is alert.                 PAST HISTORY        Primary Care Provider: Fonnie Mu, MD        PMH/PSH:    .     Past Medical History:   Diagnosis Date   . Arthritis     Knees   . Bilateral cataracts 12/05/2010    Surgery on both eyes   . Chronic gout    . Complication of anesthesia 08/012009    Takes longer than usual for me to recover, Disorientation x 1 hour, Slight Memory loss 1x    . Disorder of prostate     Enlarged.   . Diverticulosis of colon without diverticulitis    . GERD (gastroesophageal reflux disease)    . Headache     Occasional   .  Heart murmur 02/03/1954    Had murmur since I was a child   . Herpes simplex without mention of complication    . Hiatal hernia 23yrs    Controlled   . Hyperlipidemia    . Hypertension     controlled, Checks 1x week, Avg 135/80   . Irritable bowel syndrome 02/04/1999    Controlled   . Low back pain    . Lymphoma     Dx 06/2018   . 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 Multiple sclerosis 1995   . Pancreatitis 10/05/2014    Creon       He has a past surgical history that includes Tonsillectomy (1954); Colonoscopy (2015); Eye surgery (Bilateral, 2012); EUS (ENDOSCOPIC ULTRASOUND) (2016); Colonoscopy (N/A, 02/13/2016); EUS (ENDOSCOPIC ULTRASOUND) (10/2016); Spermatocelectomy (Left, 04/02/2017); and Colonoscopy (N/A, 06/09/2018).      Social/Family History:      He reports that he has never smoked. He has never used smokeless tobacco. He reports that he does not drink alcohol and does not use drugs.    Family History   Problem Relation Age of Onset   . Cancer Mother    . Arrhythmia Mother    . Atrial fibrillation Mother    . Hypertension Mother    . Heart disease Father    . Myocardial Infarction Father 66    . Dementia Father    . Cancer Father    . Hyperlipidemia Father    . Diabetes Father          Listed Medications on Arrival:    .     Home Medications             B Complex Vitamins (VITAMIN B COMPLEX IJ)          Cholecalciferol ([employer] VIT D 5000 HIGH-POTENCY) 5000 UNITS capsule     Take by mouth daily        esomeprazole (NEXIUM) 40 MG capsule     Take by mouth daily.         Febuxostat (ULORIC) 40 MG tablet     Take 40 mg by mouth daily.       fenofibrate micronized (LOFIBRA) 134 MG capsule     TAKE 1 CAPSULE BY MOUTH EVERY DAY WITH FOOD     Glucosamine Sulfate 750 MG CAPS     Take 500 mg by mouth 2 (two) times daily.        lisinopril (ZESTRIL) 5 MG tablet     TAKE 1 TABLET BY MOUTH EVERY DAY   STOP LISINOPRIL 10MG  TABLET.     Multiple Vitamin (MULTIVITAMIN) tablet     Take 1 tablet by mouth daily.       ocrelizumab (Ocrevus) 300 MG/10ML injection     Infuse 600 mg into the vein     Probiotic Product (PHILLIPS COLON HEALTH PO)     Take by mouth every evening.     rosuvastatin (CRESTOR) 10 MG tablet     Take 10 mg by mouth daily     tadalafil (CIALIS) 5 MG tablet          zolpidem (AMBIEN) 10 MG tablet     Take 10 mg by mouth nightly as needed.            Allergies: He is allergic to erythromycin and allopurinol.            VISIT INFORMATION        Reassessments/Clinical  Course:      Patient well appearing, presenting with RT ankle pain after minor trauma. XR of RT ankle without evidence of fracture or dislocation. Ankle ACE wrapped.      Conversations with Other Providers:              Medications Given in the ED:    .     ED Medication Orders (From admission, onward)    None            Procedures:      Procedures      Assessment/Plan:      Ankle sprain  - Rest, Ice, Compression, Elevation  - NSAIDs, tylenol for pain control  - Follow up with orthopedist if symptoms do not improve          Carney Bern, MD  Resident  09/30/20 [phone]

## 2020-09-30 NOTE — Discharge Instructions (Addendum)
Ankle Sprain    You have been diagnosed with an ankle sprain.    A sprain is a ligament injury, usually a tear or partial tear. Sprains can hurt as much as broken bones. Sprains can be classified by the degree of injury. A first-degree sprain is considered a minor tear. A second-degree sprain is a partial tear of the ligament. A third-degree sprain often involves a small fracture, or break, of the bone that the ligament is attached to.    Sprains are usually treated with pain medication and a splint to keep the joint from moving. You should Rest, Ice, Compress, and Elevate the injured ankle. Remember this as "RICE."   REST: Limit the use of the injured body part.   ICE: By applying ice to the affected area, swelling and pain can be reduced. Place some ice cubes in a re-sealable (Ziploc) bag and add some water. Put a thin washcloth between the bag and the skin. Apply the ice bag to the area for at least 20 minutes. Do this at least 4 times per day. Using the ice for longer times and more frequently is OK. NEVER APPLY ICE DIRECTLY TO THE SKIN.   COMPRESS: Compression means to apply pressure around the injured area such as with a splint, cast or an ACE bandage. Compression decreases swelling and improves comfort. Compression should be tight enough to relieve swelling but not so tight as to decrease circulation. Increasing pain, numbness, tingling, or change in skin color, are all signs of decreased circulation.   ELEVATE: Elevate the injured part. For example, a sprained ankle can be placed up on a chair while sitting and propped up on pillows while lying down.    You have been given an ACE BANDAGE. The bandage will compress the ankle. This increases comfort and reduces swelling. The ACE bandage should fit snugly but not so tight as to decrease circulation (blood supply). Watch for swelling of the area outside the ACE wrap. Check capillary refill (circulation) in your toenails. To do this, press on the  nail. It should turn white. When you let go, the nail should return to pink in less than 2 seconds. If it doesn't, the bandage is too tight. Loosen the wrap if you need to.    Wear the ACE bandage:   For the next 1-2 weeks.    Ankle exercises are described below. Begin the exercises as soon as you are able. They will make the ankle stronger to prevent new injuries. Do the exercises 5 to 10 times each day.   Use your big toe to draw out the letters of the alphabet on the ground. Move your ankle as you make each letter.   Sit with your leg straight out in front of you. Wrap a towel around the ball of your foot (just below your toes) and pull back. Pull hard enough to stretch the ankle. Don't pull hard enough to cause pain. Hold the stretch for 30 seconds.   Stand up. Rock onto the tiptoes of the injured foot and then return to the flat position. Repeat 10 times.   Rotate your ankle in a circle. Make 10 clockwise circles, then make 10 circles going the other way.    YOU SHOULD SEEK MEDICAL ATTENTION IMMEDIATELY, EITHER HERE OR AT THE NEAREST EMERGENCY DEPARTMENT, IF ANY OF THE FOLLOWING OCCURS:   Your pain gets much worse.   Your ankle or foot starts to tingle or it becomes numb.   Your foot   is cold or pale. This might mean there is a problem with circulation (blood supply).

## 2020-09-30 NOTE — ED Provider Notes (Signed)
Attending Note:   The patient was seen by the resident. I have also seen and examined the patient. The pertinent elements of my history, exam, assessment and plan are outlined below.   I agree with assessment and care plan, and confirm the diagnosis(es).      HPI:   70 y/o male presenting with right foot/ankle discomfort after slip on the stairs earlier today.  He missed the last step and twisted his right ankle.  No head injury, no LOC, no other injury.  He is able to bear weight.  Pain is persistent despite icing his ankle.      PE:   Right ankle: medial / lateral malleolar tenderness.  No edema, no bruising.  DP 2+.  Normal foot.  No proximal fibular tenderness    DDX: ankle fracture, ankle sprain, foot contusion  Plan:     70 y/o male presenting with isolated right ankle injury after slip and fall earlier today  His xray is negative  He is able to ambulate on the right leg  Cont NSAIDs, outpatient follow up as needed.  Rest, ice, elevation  Cont outpatient follow up

## 2020-10-16 ENCOUNTER — Other Ambulatory Visit: Payer: Self-pay | Admitting: Neurology

## 2020-11-24 ENCOUNTER — Encounter (INDEPENDENT_AMBULATORY_CARE_PROVIDER_SITE_OTHER): Payer: Self-pay | Admitting: Urology

## 2020-11-26 ENCOUNTER — Other Ambulatory Visit (INDEPENDENT_AMBULATORY_CARE_PROVIDER_SITE_OTHER): Payer: Self-pay | Admitting: Nurse Practitioner

## 2020-11-26 ENCOUNTER — Telehealth (INDEPENDENT_AMBULATORY_CARE_PROVIDER_SITE_OTHER): Payer: Self-pay | Admitting: Urology

## 2020-11-26 ENCOUNTER — Ambulatory Visit (INDEPENDENT_AMBULATORY_CARE_PROVIDER_SITE_OTHER): Payer: BLUE CROSS/BLUE SHIELD | Admitting: Nurse Practitioner

## 2020-11-26 DIAGNOSIS — N3941 Urge incontinence: Secondary | ICD-10-CM

## 2020-11-26 DIAGNOSIS — R350 Frequency of micturition: Secondary | ICD-10-CM

## 2020-11-26 DIAGNOSIS — R351 Nocturia: Secondary | ICD-10-CM

## 2020-11-26 DIAGNOSIS — R3915 Urgency of urination: Secondary | ICD-10-CM

## 2020-11-26 NOTE — Progress Notes (Signed)
TELEMEDICINE VISIT              CLINICAL SUMMARY        Verbal Consent      Verbal consent has been obtained from the patient to conduct a telephone visit encounter: yes      Chief Complaint:      Urgency  Pain in penis      Problem List, Medications, and Allergies reviewed: yes      HPI:  HPI   This is a 85 yr M with hx of MS followed by Dr. Carroll Sage  PTNS has not been effective for him; refractory to meds  He complains of urgency and sense of incomplete emptying    UA negative         Assessment/Plan:    1. Urinary urgency  - Urine culture    2. Nocturia  - Urine culture    3. Urinary frequency  - Urine culture    4. Urge incontinence of urine  - Urine culture    UA negative; cx pending  Has neurogenic bladder OAB symptoms  Encouraged f/u w Dr Carroll Sage to discuss 3rd line treatments       Time spent in medical discussion: 5 to 10 minutes    Arnoldo Morale, FNP

## 2020-11-26 NOTE — Telephone Encounter (Signed)
Pt next appt is on 7/12 with Dr. Carroll Sage however pt would like to be seen sooner if possible due to his new symptoms. Pt is experiencing discomfort in his penis area, pt voids but still has the urgency to void again, and his urinary frequency has increased, Pt is aware that Dr. Lucianne Muss schedule is fully booked but that I would still relay the message. Please contact pt if further information I needed.    TY

## 2020-11-26 NOTE — Telephone Encounter (Signed)
Spoke with patient. Patient is having discomfort around penile area, urgency and frequency. No fever/chills. Penile discomfort is a new symptoms. Advised patient that he can come in for urine sample drop off to check infection. He can come to our office lab any time between 8:30-3pm Mon-Fri. We have specimen cups here that he can use to collect your specimen. No appointment is needed, he can come as a walk in. Patient verbalized understanding and had no further question.

## 2020-11-27 LAB — URINALYSIS POC
Blood, UA POCT: NEGATIVE
POCT Urine Bilirubin: NEGATIVE
POCT Urine Glucose: NEGATIVE mg/dL
POCT Urine Ketones: NEGATIVE mg/dL
POCT Urine Nitrites: NEGATIVE
POCT Urine Urobilibogen: 0.2 mg/dL (ref 0.0–1.0)
POCT Urine pH: 5.5 (ref 5.0–8.0)
Protein, UR POCT: NEGATIVE mg/dL
Urine Specific Gravity POC: 1.02 (ref 1.001–1.035)
Urine leukocyte Esterase, POCT: NEGATIVE

## 2020-12-03 ENCOUNTER — Ambulatory Visit (INDEPENDENT_AMBULATORY_CARE_PROVIDER_SITE_OTHER): Payer: BLUE CROSS/BLUE SHIELD | Admitting: Urology

## 2020-12-18 ENCOUNTER — Encounter (INDEPENDENT_AMBULATORY_CARE_PROVIDER_SITE_OTHER): Payer: Self-pay | Admitting: Urology

## 2020-12-18 ENCOUNTER — Ambulatory Visit (INDEPENDENT_AMBULATORY_CARE_PROVIDER_SITE_OTHER): Payer: BLUE CROSS/BLUE SHIELD | Admitting: Urology

## 2020-12-18 VITALS — BP 149/71 | HR 79 | Ht 68.11 in | Wt 176.0 lb

## 2020-12-18 DIAGNOSIS — G35 Multiple sclerosis: Secondary | ICD-10-CM

## 2020-12-18 DIAGNOSIS — R351 Nocturia: Secondary | ICD-10-CM

## 2020-12-18 DIAGNOSIS — N3281 Overactive bladder: Secondary | ICD-10-CM

## 2020-12-18 DIAGNOSIS — N528 Other male erectile dysfunction: Secondary | ICD-10-CM

## 2020-12-18 LAB — URINALYSIS POC
Blood, UA POCT: NEGATIVE
POCT Urine Bilirubin: NEGATIVE
POCT Urine Glucose: NEGATIVE mg/dL
POCT Urine Ketones: NEGATIVE mg/dL
POCT Urine Nitrites: NEGATIVE
POCT Urine Urobilibogen: 0.2 mg/dL (ref 0.0–1.0)
POCT Urine pH: 6.5 (ref 5.0–8.0)
Protein, UR POCT: NEGATIVE mg/dL
Urine Specific Gravity POC: 1.025 (ref 1.001–1.035)
Urine leukocyte Esterase, POCT: NEGATIVE

## 2020-12-18 MED ORDER — MIRABEGRON ER 50 MG PO TB24
50.0000 mg | ORAL_TABLET | Freq: Every day | ORAL | 6 refills | Status: DC
Start: 2020-12-18 — End: 2021-02-04

## 2020-12-18 NOTE — Patient Instructions (Addendum)
- Continue cialis 5mg  daily   - We will start you on a new medication called myrbetriq 50mg .  On rare occasions this can elevate your blood pressure.  Please have your blood pressure checked in 1 week.  Stop medication if elevated above your baseline.  - We discussed a bladder stimulatory (interstim) as an alternative   - Follow up in 6 weeks    Table of Contents    Introduction    The Urinary System   How Does the Urinary System Work?   Why Do Some People Have Bladder Control Problems?    Sacral Nerve Stimulation (SNS)   What is Sacral Nerve Stimulation Therapy?     SNS Therapy: Test Stimulation and Implant   How is the Test for SNS Therapy Done?    Placing the Test Stimulation Lead    At Home    Your Follow-up Visit  How is the Implant for SNS Therapy Done?   Implanting the SNS Device   After Surgery   Adjusting the Level of Stimulation at Home    Clinical Study Results   Problems or Complications    Candidates for SNS Therapy   Who Are Candidates for SNS Therapy?   Who Are Not Candidates for SNS Therapy?   What Other Limitations Apply to SNS Therapy?    Is SNS a Cure for Bladder Control Problems?                   Introduction  Millions of people of all ages suffer from bladder control problems such as retention and overactive bladder.  Effects of bladder control problems can be devastating.  You have probably found that treatments such as drugs, behavior modification, diet changes, pelvic floor exercises or the use of a catheter to empty your bladder did not effectively treat your symptoms.  Your doctor would like you to consider a therapy called sacral nerve stimulation (SNS).  SNS involves the use of a device that can be thought of as a pacemaker for the bladder.     SNS therapy is not experimental.  InterStim Therapy (InterStim is a registered trademark of [employer] is a sacral nerve stimulation therapy made by [employer].  It was approved by the [place] Food and Drug Administration (FDA) in 1997 and  has been used successfully to treat thousands of patients worldwide.     The intent of this handout is to help you understand the therapy.  After you have read about the therapy, your doctor will help you make an informed decision about whether to proceed with a test to see if sacral nerve stimulation will work for you.  If you want more information and/or plan to move forward with the therapy ask your doctor for a more detailed manual available from [employer].    The Urinary System  How Does the Urinary System Work?  To understand how sacral nerve stimulation works, it is helpful to understand how the urinary system works.  The urinary system includes two kidneys, two ureters, the bladder and the urethra (see Figure 1).  The kidneys remove excess fluid and waste products from the blood and continuously produce urine.  The ureters carry the urine to the bladder where the urine is stored.  A muscle called a sphincter controls the opening and closing of the urethra (urine flows through the urethra during urination).       Figure 1.   Anatomy of the Bladder Control System    When the  bladder begins to fill with urine, a message is sent along the sacral nerves to the brain telling the brain that the bladder is getting full (see Figure 2).  As the bladder fills, this message to the brain becomes stronger.  When the message becomes strong enough, and you decide to urinate, your brain sends a message back to the bladder along the sacral nerves telling the bladder muscle to contract and the pelvic muscles to relax to allow urine to empty from the bladder (urination). Urination is usually under voluntary control.  This means that you decide when and where you want to urinate.                                         [employer] Used by permission.      Figure 2.  Communication Between the Brain and the Bladder    Why Do Some People Have Bladder Control Problems?  Sometimes, the two-way communication between the brain and  bladder is disrupted.  When this happens, patients may experience symptoms of bladder control problems.  For many patients, sacral nerve stimulation may restore the communication between the brain and the bladder therefore reducing the symptoms associated with bladder control problems.     Sacral Nerve Stimulation (SNS)    What is Sacral Nerve Stimulation Therapy?  Sacral nerve stimulation therapy involves delivering an electrical pulse to the sacral nerves (located just above your tailbone).  This stimulation may facilitate the communication between the brain and bladder, and may relieve the symptoms of urinary retention or symptoms of overactive bladder, including urinary urge incontinence and significant symptoms of urgency-frequency in some patients.      SNS Therapy: Test Stimulation and Implant   SNS therapy is delivered in two procedures.  The first is the test to see if the therapy will work for you.  The next procedure, an implant of a SNS system, may be done if the test stimulation procedure was successful.      One of the biggest advantages of SNS is the test that allows you to try sacral nerve stimulation for several days.  Once you have experienced the therapy, your doctor will discuss the results of the test with you.  If the test was successful, you along with your doctor will decide whether to have the complete system placed.  The test involves placement of a lead (thin wire) that is placed near the sacral nerves.  There will be a thin wire that exits your skin.  This wire is connected to a small cable and test stimulator that is worn on a belt for the duration of the test at home.  The test stimulator provides stimulation similar to that felt with the implant.     The next step, the implant of the SNS system, may be done after a successful test stimulation.  It involves placement of a neurostimulator Engineer, maintenance (IT) and battery).  The implanted system is entirely under the skin          How is the Test  for SNS Therapy Done?    Placing the Test Stimulation Lead  There are two types of leads used for test stimulation, a temporary lead or a long-term lead. Your doctor will explain the test stimulation procedure you will be undergoing.     The test stimulation lead is inserted in the doctor's office or hospital, depending  upon the type of lead placed and/or your physician's preference.   Your doctor will explain the type of anesthesia that will be used for your procedure.  The medical team will make you as comfortable as possible during the procedure. You may be given pain medication and a sedative that will make your feel relaxed and drowsy, but able to cooperate during the procedure.  Or, you may be given general anesthesia.      While you are lying on your stomach, your doctor will insert a lead and position it near a  sacral nerve.  The sacral nerves are located near the tailbone.  During the procedure you may be asked to describe what you feel when the sacral nerve is stimulated.  You may feel a "pulling", "tingling" or "tapping" sensation in your pelvic muscles and movement of your big toe.  Women may feel a sensation in the vaginal area and men in the scrotum.  Most likely you will go home the same day the lead is placed.        If a temporary lead was used, it will exit your skin in your lower back.  It will be taped to your skin and attached to the external test stimulator that you wear when you go home (see figure 3).      If a long-term lead is used, a small wire is attached to the lead.  The wire exits a small incision in your lower back or upper buttock.  This wire is connected to the external test stimulator that you wear when you go home (see figure 4).        Figure 3.  Test Stimulation Using the Temporary Lead          Figure 4.  Test Stimulation Using the Long-term Lead    At Home  You will go home with a temporary system to "test" how the therapy works for you.  The test stimulator generates mild,  carefully controlled electrical pulses that are carried to the sacral nerve. These pulses cause the sensations of "pulling" or "tingling" or "tapping" in your pelvic area like you felt during the placement of the lead.  Stimulation sensations vary from person to person, but should be comfortable.  You will be shown how to control the intensity of stimulation and how to turn the system ON or OFF.      While undergoing the test, you will document your symptoms in a voiding diary.  In the diary you will record how much and how often you urinate, any leaking episodes and any symptoms of urgency and frequency.  If you have urinary retention and catheterize to empty your bladder, you will record the volume and time you catheterized. During the test you will know how stimulation feels and if it improves your symptoms. If your bladder control symptoms improved during the test stimulation, you and your doctor will decide if you want to continue with the therapy.      Your Follow-up Office Visit  After the test, you will meet with your doctor to review your voiding diary and discuss whether your symptoms improved. Be aware that your bladder control symptoms will most likely return within a few hours after stopping the test stimulation.        If your symptoms improved - You and your doctor will decide if the SNS therapy is appropriate for you. Generally, if you have no problems and your symptoms improved during the test stimulation, you  may be a potential candidate for SNS therapy.      If your symptoms did NOT improve - The test stimulation may be repeated. Or, you and your doctor may decide to try another procedure or different therapy.        How is the Implant for SNS Therapy Done?  The InterStim System for urinary control (made by [employer]) is a sacral nerve stimulation system.  The system includes a neurostimulator and lead that are surgically placed and programmers to adjust the stimulation settings.      Implanting  the SNS Device  The implant procedure is performed in an operating room. As with the test stimulation procedure, the medical team will make you as comfortable as possible during the procedure.  Your doctor will discuss the type of anesthesia to be used. You will either be given pain medication and a sedative or general anesthesia.       You will have one or two incisions. The incision made for the neurostimulator will be about 2 inches long; the other incision will be small, about  inch or less. The entire system will be under your skin.        If a temporary lead was used for the test stimulation:  the lead will be removed  a long-term lead and neurostimulator will be implanted in the upper buttock or abdomen    If a long-term lead was used for the test stimulation (see figure 5):   the long-term lead will remain in place  the external wire used for test stimulation will be removed, and  a neurostimulator will be connected to the long-term lead and placed under the skin in the upper buttock or abdomen.            Figure 5.  Implant of Lead and Neurostimulator (battery and electronics)     After Surgery  You should be able to go home the same day of the procedure. Your incisions may feel sore and somewhat painful, especially during the first two weeks. Your doctor may prescribe a medication to control your discomfort. As the incisions heal, you should become more comfortable and will be able to gradually increase your activity level. Your doctor or nurse will give you directions to follow after the surgery.    Your physician turns on the neurostimulator after surgery.  When the system is turned ON, you will feel a slight tingling, tapping or pulling sensation similar to what you felt during the test stimulation. The sensation should not be painful. The level of stimulation will be increased or decreased to achieve the best control of your symptoms.     Adjusting the Level of Stimulation at Home  At home, you can  control the level of stimulation and turn the SNS system ON and OFF with the patient programmer. The programmer is a hand-held device and is about the size of a cell phone (see Figure 6).  When the programmer is held over the neurostimulator, the programmer "talks" to the neurostimulator to change the settings.    (For more information, refer to technical manual or the Reference Card packaged with the InterStim System patient programmer.)      Figure 6. Patient Programmer    Clinical Study Results  [employer] conducted an international, multi-center clinical study using [employer] InterStim Therapy. Patients included in the study had symptoms of urge incontinence, urgency-frequency or retention.      The study showed that the InterStim Therapy successfully treated the symptoms  of urge incontinence, urgency-frequency or retention. The results of the study are summarized in Figure 7.              *The multi-center study included 23 centers worldwide.  A total of 581 patients were studied with 219 of them receiving InterStim Therapy.  **In patients with a baseline degree of  >7 voids per day.  Success is defined as increased voided volume with the same or reduced degree of urgency.  Figure 7.  Clinical Study Results      Problems or Complications  As with any surgical procedure, problems can occur. These problems may be resolved with reprogramming of the system, medications or surgery. The InterStim System can always be removed, if necessary.          The following events and approximate rate of occurrence occurred during the InterStim Therapy clinical study:  pain where the neurostimulator is placed (15%), new pain (9%), movement of the lead (8%), infection (6%), sudden and brief increase in stimulation -  sometimes described as shocking or jolting - (6%), pain at lead site (5%), significant change in bowel function (3%) and other1 (16%).     1The following problems each occurred less than 2% of the time:  technical  problems, suspected device problem, change in menstrual cycle, adverse change in voiding function, persistent skin irritation, suspected nerve injury, and device rejection.  The following problems each occurred less than 0.5% of the time:  change in sensation of stimulation, grand mal seizure, hematoma or seroma, urinary hesitancy, neurostimulator turns on or off, lack of orgasm, lack of efficacy, numbness and tingling, foot/leg movement, strong anal sensation, unable to perceive stimulation, stress urinary incontinence, swollen feeling in abdomen, vaginal cramps, superficial connection, and possible skin perforation at neurostimulator.    You should be aware that none of these problems in the clinical study resulted in permanent injury to patients.  Additional information on clinical studies can be found at [email or web address]     It is important to note that since this clinical study was conducted, changes in InterStim Therapy and surgical techniques have been made. For instance, the neurostimulator is now commonly placed in the upper buttock, rather than in the abdomen as in the original study.  In addition, a new lead was developed which made the procedure much less invasive.     Candidates for SNS Therapy    Who Are Candidates for SNS Therapy?  SNS is intended for patients who have failed or could not tolerate more conservative treatments.  Bladder control problems that may improve with SNS therapy include:  Overactive bladder (includes urge incontinence and urgency frequency-alone or in combination)  Urge incontinence - The involuntary loss of urine associated with a sudden, strong desire to void (urgency).    Urgency-frequency- Frequent, uncontrollable urges to urinate (urgency) and voiding often in very small amounts (frequency).    Urinary retention - The inability to empty the bladder    Who Are Not Candidates for SNS Therapy?  InterStim Therapy is not intended to treat:  symptoms of stress incontinence.   People with stress incontinence lose urine when they exercise, sneeze, cough, or laugh.  mechanical obstructions such as enlarged prostate (benign prostatic hypertrophy/BPH), cancer or narrowing of the urethra (urethral strictures).    Safety and effectiveness of InterStim Therapy has not been studied for stimulation with two leads, or for patients who are pregnant, have diabetes, neurological diseases or multiple sclerosis, or are under 67 years old.  What Other Limitations Apply to SNS Therapy?  Some known limitations for this therapy include: a failed test stimulation, or inability to use the patient programmer. Patients with other stimulation devices such as a pacemaker may also not be candidates for SNS.      Inform anyone treating you that you CANNOT have any shortwave diathermy, microwave diathermy or therapeutic ultrasound diathermy (all now referred to as diathermy) anywhere on your body because you have an implanted neurostimulation system. Energy from diathermy can be transferred through your implanted system, and can cause tissue damage, resulting in severe injury or death.     Is SNS a Cure for Bladder Control Problems?  As with any therapy, your own individual results may vary.  While many patients implanted with InterStim Therapy experienced relief of many of their symptoms, the therapy will not result in complete improvement or a cure.      You should know that many patients have experienced positive results and experienced an improved quality of life after having the InterStim Therapy implanted.  To learn more about the therapy, visit [email or web address] or ask your doctor for the [employer] patient manual and a brochure to read stories from patients who decided to have the InterStim System implanted.

## 2020-12-18 NOTE — Progress Notes (Signed)
Subjective:      Patient ID: [name] Young is a 70 y.o. male     Chief Complaint:  1. Overactive bladder    2. Other male erectile dysfunction    3. Nocturia    4. MS (multiple sclerosis)        This is a pleasant 64M. Visit today is urinary frequency and nocturia. He was referred by Dr. Mayra Neer for this issue.He reports voids every 2 hours and nocturia x3-4. There is no significant urgency or UUI.This has been ongoing for about 10 years and slightly worsening. He feels that his stream is normaland reportedly had multiple office flow studies which have been normal. He has tried multiple medications including Detrol, Myrbetriq and Flomax. He felt that 4 mg of Detrol actually made his symptoms worse. He is currently on daily Cialis. He felt that after initially starting the Cialis symptoms were better but this was not sustained. He has a history of MS. He drinks 40 to 50 ounces of fluid daily and symptoms do not seem to be related to fluid intake. Reports normal PSA with PCP.    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  He reports that after the cystoscopy he had 24 hours of complete incontinence followed by 3 weeks of perineal pain.  He was treated with a course of antibiotics for presumptive infection.  However, he had urinalysis in our office and culture which were negative.  He had a renal ultrasound which was unremarkable.   He has tried oxybutynin without significant change in his nocturia, x3-4.  His PVRs have been normal.    Today  Since his last visit he completed 12 sessions of induction PTNS.  He initially felt that this was helpful but ultimately decided that this was not helpful so maintenance was not continued.  He returns today for follow up and reports worsening nocturia now x 4-5.  He has also been experiencing some  difficulty emptying at night. His stream is normal during the day.  He has been having some penile pain which is intense at times but now improving.  No dysuria.       The following portions of the patient's history were reviewed and updated as appropriate: allergies, current medications, past family history, past medical history, past social history, past surgical history and problem list.    Review of Systems  Systems reviewed per the HPI and below:     History obtained from the patient     General ROS: no fevers, or chills     Gastrointestinal ROS: no abdominal pain, change in bowel habits     Musculoskeletal ROS:  no swelling to lower extremities, no back pain     Objective:     Vitals:    12/18/20 0913   BP: 149/71   Pulse: 79       Physical Exam   Constitutional:  Well-developed, well-nourished, and in no distress.   Pulmonary/Chest: Effort normal.   Neurological: Pt is alert and oriented to person, place, and time.    Psychiatric: Mood, memory, affect and judgment normal.       Lab Review   Reviewed results as listed below. Discussed findings with patient.     Urine analysis shows   Results     ** No results found for the last 24 hours. **  Radiology Review   Reviewed results as listed below. Discussed results with the patient and answered questions to the best of my ability.   No new imaging       Assessment:       Urinary frequency and nocturia  H/o MS  ED  Penile pain     Plan:   Patient Instructions   - Continue cialis 5mg  daily   - We will start you on a new medication called myrbetriq 50mg .  On rare occasions this can elevate your blood pressure.  Please have your blood pressure checked in 1 week.  Stop medication if elevated above your baseline.  - We discussed a bladder stimulatory (interstim) as an alternative   - Follow up in 6 weeks    Table of Contents    Introduction    The Urinary System   How Does the Urinary System Work?   Why Do Some People Have Bladder Control Problems?    Sacral  Nerve Stimulation (SNS)   What is Sacral Nerve Stimulation Therapy?     SNS Therapy: Test Stimulation and Implant   How is the Test for SNS Therapy Done?    Placing the Test Stimulation Lead    At Home    Your Follow-up Visit  How is the Implant for SNS Therapy Done?   Implanting the SNS Device   After Surgery   Adjusting the Level of Stimulation at Home    Clinical Study Results   Problems or Complications    Candidates for SNS Therapy   Who Are Candidates for SNS Therapy?   Who Are Not Candidates for SNS Therapy?   What Other Limitations Apply to SNS Therapy?    Is SNS a Cure for Bladder Control Problems?                   Introduction  Millions of people of all ages suffer from bladder control problems such as retention and overactive bladder.  Effects of bladder control problems can be devastating.  You have probably found that treatments such as drugs, behavior modification, diet changes, pelvic floor exercises or the use of a catheter to empty your bladder did not effectively treat your symptoms.  Your doctor would like you to consider a therapy called sacral nerve stimulation (SNS).  SNS involves the use of a device that can be thought of as a pacemaker for the bladder.     SNS therapy is not experimental.  InterStim Therapy (InterStim is a registered trademark of [employer] is a sacral nerve stimulation therapy made by [employer].  It was approved by the [place] Food and Drug Administration (FDA) in 1997 and has been used successfully to treat thousands of patients worldwide.     The intent of this handout is to help you understand the therapy.  After you have read about the therapy, your doctor will help you make an informed decision about whether to proceed with a test to see if sacral nerve stimulation will work for you.  If you want more information and/or plan to move forward with the therapy ask your doctor for a more detailed manual available from [employer].    The Urinary System  How Does the  Urinary System Work?  To understand how sacral nerve stimulation works, it is helpful to understand how the urinary system works.  The urinary system includes two kidneys, two ureters, the bladder and the urethra (see Figure 1).  The kidneys remove excess fluid and  waste products from the blood and continuously produce urine.  The ureters carry the urine to the bladder where the urine is stored.  A muscle called a sphincter controls the opening and closing of the urethra (urine flows through the urethra during urination).       Figure 1.   Anatomy of the Bladder Control System    When the bladder begins to fill with urine, a message is sent along the sacral nerves to the brain telling the brain that the bladder is getting full (see Figure 2).  As the bladder fills, this message to the brain becomes stronger.  When the message becomes strong enough, and you decide to urinate, your brain sends a message back to the bladder along the sacral nerves telling the bladder muscle to contract and the pelvic muscles to relax to allow urine to empty from the bladder (urination). Urination is usually under voluntary control.  This means that you decide when and where you want to urinate.                                         [employer] Used by permission.      Figure 2.  Communication Between the Brain and the Bladder    Why Do Some People Have Bladder Control Problems?  Sometimes, the two-way communication between the brain and bladder is disrupted.  When this happens, patients may experience symptoms of bladder control problems.  For many patients, sacral nerve stimulation may restore the communication between the brain and the bladder therefore reducing the symptoms associated with bladder control problems.     Sacral Nerve Stimulation (SNS)    What is Sacral Nerve Stimulation Therapy?  Sacral nerve stimulation therapy involves delivering an electrical pulse to the sacral nerves (located just above your tailbone).   This stimulation may facilitate the communication between the brain and bladder, and may relieve the symptoms of urinary retention or symptoms of overactive bladder, including urinary urge incontinence and significant symptoms of urgency-frequency in some patients.      SNS Therapy: Test Stimulation and Implant   SNS therapy is delivered in two procedures.  The first is the test to see if the therapy will work for you.  The next procedure, an implant of a SNS system, may be done if the test stimulation procedure was successful.      One of the biggest advantages of SNS is the test that allows you to try sacral nerve stimulation for several days.  Once you have experienced the therapy, your doctor will discuss the results of the test with you.  If the test was successful, you along with your doctor will decide whether to have the complete system placed.  The test involves placement of a lead (thin wire) that is placed near the sacral nerves.  There will be a thin wire that exits your skin.  This wire is connected to a small cable and test stimulator that is worn on a belt for the duration of the test at home.  The test stimulator provides stimulation similar to that felt with the implant.     The next step, the implant of the SNS system, may be done after a successful test stimulation.  It involves placement of a neurostimulator Engineer, maintenance (IT) and battery).  The implanted system is entirely under the skin  How is the Test for SNS Therapy Done?    Placing the Test Stimulation Lead  There are two types of leads used for test stimulation, a temporary lead or a long-term lead. Your doctor will explain the test stimulation procedure you will be undergoing.     The test stimulation lead is inserted in the doctor's office or hospital, depending upon the type of lead placed and/or your physician's preference.   Your doctor will explain the type of anesthesia that will be used for your procedure.  The medical team will  make you as comfortable as possible during the procedure. You may be given pain medication and a sedative that will make your feel relaxed and drowsy, but able to cooperate during the procedure.  Or, you may be given general anesthesia.      While you are lying on your stomach, your doctor will insert a lead and position it near a  sacral nerve.  The sacral nerves are located near the tailbone.  During the procedure you may be asked to describe what you feel when the sacral nerve is stimulated.  You may feel a "pulling", "tingling" or "tapping" sensation in your pelvic muscles and movement of your big toe.  Women may feel a sensation in the vaginal area and men in the scrotum.  Most likely you will go home the same day the lead is placed.        . If a temporary lead was used, it will exit your skin in your lower back.  It will be taped to your skin and attached to the external test stimulator that you wear when you go home (see figure 3).      . If a long-term lead is used, a small wire is attached to the lead.  The wire exits a small incision in your lower back or upper buttock.  This wire is connected to the external test stimulator that you wear when you go home (see figure 4).        Figure 3.  Test Stimulation Using the Temporary Lead          Figure 4.  Test Stimulation Using the Long-term Lead    At Home  You will go home with a temporary system to "test" how the therapy works for you.  The test stimulator generates mild, carefully controlled electrical pulses that are carried to the sacral nerve. These pulses cause the sensations of "pulling" or "tingling" or "tapping" in your pelvic area like you felt during the placement of the lead.  Stimulation sensations vary from person to person, but should be comfortable.  You will be shown how to control the intensity of stimulation and how to turn the system ON or OFF.      While undergoing the test, you will document your symptoms in a voiding diary.  In the  diary you will record how much and how often you urinate, any leaking episodes and any symptoms of urgency and frequency.  If you have urinary retention and catheterize to empty your bladder, you will record the volume and time you catheterized. During the test you will know how stimulation feels and if it improves your symptoms. If your bladder control symptoms improved during the test stimulation, you and your doctor will decide if you want to continue with the therapy.      Your Follow-up Office Visit  After the test, you will meet with your doctor to review your voiding diary and  discuss whether your symptoms improved. Be aware that your bladder control symptoms will most likely return within a few hours after stopping the test stimulation.        If your symptoms improved - You and your doctor will decide if the SNS therapy is appropriate for you. Generally, if you have no problems and your symptoms improved during the test stimulation, you may be a potential candidate for SNS therapy.      If your symptoms did NOT improve - The test stimulation may be repeated. Or, you and your doctor may decide to try another procedure or different therapy.        How is the Implant for SNS Therapy Done?  The InterStim System for urinary control (made by [employer]) is a sacral nerve stimulation system.  The system includes a neurostimulator and lead that are surgically placed and programmers to adjust the stimulation settings.      Implanting the SNS Device  The implant procedure is performed in an operating room. As with the test stimulation procedure, the medical team will make you as comfortable as possible during the procedure.  Your doctor will discuss the type of anesthesia to be used. You will either be given pain medication and a sedative or general anesthesia.       You will have one or two incisions. The incision made for the neurostimulator will be about 2 inches long; the other incision will be small, about   inch or less. The entire system will be under your skin.        . If a temporary lead was used for the test stimulation:  . the lead will be removed  . a long-term lead and neurostimulator will be implanted in the upper buttock or abdomen    . If a long-term lead was used for the test stimulation (see figure 5):   . the long-term lead will remain in place  . the external wire used for test stimulation will be removed, and  . a neurostimulator will be connected to the long-term lead and placed under the skin in the upper buttock or abdomen.            Figure 5.  Implant of Lead and Neurostimulator (battery and electronics)     After Surgery  You should be able to go home the same day of the procedure. Your incisions may feel sore and somewhat painful, especially during the first two weeks. Your doctor may prescribe a medication to control your discomfort. As the incisions heal, you should become more comfortable and will be able to gradually increase your activity level. Your doctor or nurse will give you directions to follow after the surgery.    Your physician turns on the neurostimulator after surgery.  When the system is turned ON, you will feel a slight tingling, tapping or pulling sensation similar to what you felt during the test stimulation. The sensation should not be painful. The level of stimulation will be increased or decreased to achieve the best control of your symptoms.     Adjusting the Level of Stimulation at Home  At home, you can control the level of stimulation and turn the SNS system ON and OFF with the patient programmer. The programmer is a hand-held device and is about the size of a cell phone (see Figure 6).  When the programmer is held over the neurostimulator, the programmer "talks" to the neurostimulator to change the settings.    (For more  information, refer to technical manual or the Reference Card packaged with the InterStim System patient programmer.)      Figure 6. Patient  Programmer    Clinical Study Results  [employer] conducted an international, multi-center clinical study using [employer] InterStim Therapy. Patients included in the study had symptoms of urge incontinence, urgency-frequency or retention.      The study showed that the InterStim Therapy successfully treated the symptoms of urge incontinence, urgency-frequency or retention. The results of the study are summarized in Figure 7.              *The multi-center study included 23 centers worldwide.  A total of 581 patients were studied with 219 of them receiving InterStim Therapy.  **In patients with a baseline degree of  >7 voids per day.  Success is defined as increased voided volume with the same or reduced degree of urgency.  Figure 7.  Clinical Study Results      Problems or Complications  As with any surgical procedure, problems can occur. These problems may be resolved with reprogramming of the system, medications or surgery. The InterStim System can always be removed, if necessary.          The following events and approximate rate of occurrence occurred during the InterStim Therapy clinical study:  pain where the neurostimulator is placed (15%), new pain (9%), movement of the lead (8%), infection (6%), sudden and brief increase in stimulation -  sometimes described as shocking or jolting - (6%), pain at lead site (5%), significant change in bowel function (3%) and other1 (16%).     1The following problems each occurred less than 2% of the time:  technical problems, suspected device problem, change in menstrual cycle, adverse change in voiding function, persistent skin irritation, suspected nerve injury, and device rejection.  The following problems each occurred less than 0.5% of the time:  change in sensation of stimulation, grand mal seizure, hematoma or seroma, urinary hesitancy, neurostimulator turns on or off, lack of orgasm, lack of efficacy, numbness and tingling, foot/leg movement, strong anal sensation,  unable to perceive stimulation, stress urinary incontinence, swollen feeling in abdomen, vaginal cramps, superficial connection, and possible skin perforation at neurostimulator.    You should be aware that none of these problems in the clinical study resulted in permanent injury to patients.  Additional information on clinical studies can be found at [email or web address]     It is important to note that since this clinical study was conducted, changes in InterStim Therapy and surgical techniques have been made. For instance, the neurostimulator is now commonly placed in the upper buttock, rather than in the abdomen as in the original study.  In addition, a new lead was developed which made the procedure much less invasive.     Candidates for SNS Therapy    Who Are Candidates for SNS Therapy?  SNS is intended for patients who have failed or could not tolerate more conservative treatments.  Bladder control problems that may improve with SNS therapy include:  Overactive bladder (includes urge incontinence and urgency frequency-alone or in combination)  . Urge incontinence - The involuntary loss of urine associated with a sudden, strong desire to void (urgency).    . Urgency-frequency- Frequent, uncontrollable urges to urinate (urgency) and voiding often in very small amounts (frequency).    Urinary retention - The inability to empty the bladder    Who Are Not Candidates for SNS Therapy?  InterStim Therapy is not intended to treat:  . symptoms  of stress incontinence.  People with stress incontinence lose urine when they exercise, sneeze, cough, or laugh.  . mechanical obstructions such as enlarged prostate (benign prostatic hypertrophy/BPH), cancer or narrowing of the urethra (urethral strictures).    Safety and effectiveness of InterStim Therapy has not been studied for stimulation with two leads, or for patients who are pregnant, have diabetes, neurological diseases or multiple sclerosis, or are under 54 years old.       What Other Limitations Apply to SNS Therapy?  Some known limitations for this therapy include: a failed test stimulation, or inability to use the patient programmer. Patients with other stimulation devices such as a pacemaker may also not be candidates for SNS.      Inform anyone treating you that you CANNOT have any shortwave diathermy, microwave diathermy or therapeutic ultrasound diathermy (all now referred to as diathermy) anywhere on your body because you have an implanted neurostimulation system. Energy from diathermy can be transferred through your implanted system, and can cause tissue damage, resulting in severe injury or death.     Is SNS a Cure for Bladder Control Problems?  As with any therapy, your own individual results may vary.  While many patients implanted with InterStim Therapy experienced relief of many of their symptoms, the therapy will not result in complete improvement or a cure.      You should know that many patients have experienced positive results and experienced an improved quality of life after having the InterStim Therapy implanted.  To learn more about the therapy, visit www.[email or web address] or ask your doctor for the [employer] patient manual and a brochure to read stories from patients who decided to have the InterStim System implanted.                    Orders  No orders of the defined types were placed in this encounter.

## 2020-12-26 ENCOUNTER — Encounter (INDEPENDENT_AMBULATORY_CARE_PROVIDER_SITE_OTHER): Payer: Self-pay

## 2021-01-02 ENCOUNTER — Encounter (INDEPENDENT_AMBULATORY_CARE_PROVIDER_SITE_OTHER): Payer: Self-pay | Admitting: Nurse Practitioner

## 2021-01-02 MED ORDER — GEMTESA 75 MG PO TABS
75.0000 mg | ORAL_TABLET | Freq: Every day | ORAL | 11 refills | Status: DC
Start: 2021-01-02 — End: 2021-02-04

## 2021-01-07 ENCOUNTER — Encounter (INDEPENDENT_AMBULATORY_CARE_PROVIDER_SITE_OTHER): Payer: Self-pay

## 2021-01-14 ENCOUNTER — Ambulatory Visit (INDEPENDENT_AMBULATORY_CARE_PROVIDER_SITE_OTHER): Payer: BLUE CROSS/BLUE SHIELD | Admitting: Urology

## 2021-02-04 ENCOUNTER — Ambulatory Visit (INDEPENDENT_AMBULATORY_CARE_PROVIDER_SITE_OTHER): Payer: BLUE CROSS/BLUE SHIELD | Admitting: Urology

## 2021-02-04 ENCOUNTER — Encounter (INDEPENDENT_AMBULATORY_CARE_PROVIDER_SITE_OTHER): Payer: Self-pay | Admitting: Urology

## 2021-02-04 VITALS — BP 133/65 | HR 66 | Ht 68.11 in | Wt 179.0 lb

## 2021-02-04 DIAGNOSIS — G35 Multiple sclerosis: Secondary | ICD-10-CM

## 2021-02-04 DIAGNOSIS — R351 Nocturia: Secondary | ICD-10-CM

## 2021-02-04 DIAGNOSIS — N3281 Overactive bladder: Secondary | ICD-10-CM

## 2021-02-04 NOTE — Patient Instructions (Signed)
-   Continue cialis 5 mg daily.  You may increase to 20 mg as needed for sexual activity   - Discussed SNS with your neurologist and let me know how you want to proceed  - Follow up in 6 months

## 2021-02-04 NOTE — Progress Notes (Signed)
Subjective:      Patient ID: [name] Young is a 70 y.o. male     Chief Complaint:  1. Overactive bladder    2. Nocturia    3. MS (multiple sclerosis)      This is a pleasant 36M.  Visit today is urinary frequency and nocturia.  He was referred by Dr. Mayra Neer for this issue. He reports voids every 2 hours and nocturia x3-4.  There is no significant urgency or UUI.   This has been ongoing for about 10 years and slightly worsening.  He feels that his stream is normal and reportedly had multiple office flow studies which have been normal.  He has tried multiple medications including Detrol, Myrbetriq and Flomax.  He felt that 4 mg of Detrol actually made his symptoms worse.  He is currently on daily Cialis.  He felt that after initially starting the Cialis symptoms were better but this was not sustained.  He has a history of MS.  He drinks 40 to 50 ounces of fluid daily and symptoms do not seem to be related to fluid intake.  Reports normal PSA with PCP.       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  He reports that after the cystoscopy he had 24 hours of complete incontinence followed by 3 weeks of perineal pain.  He was treated with a course of antibiotics for presumptive infection.  However, he had urinalysis in our office and culture which were negative.  He had a renal ultrasound which was unremarkable.   He has tried oxybutynin without significant change in his nocturia, x3-4.  His PVRs have been normal.     12/18/20  Since his last visit he completed 12 sessions of induction PTNS.  He initially felt that this was helpful but ultimately decided that this was not helpful so maintenance was not continued.  He returns today for follow up and reports worsening nocturia now x 4-5.  He has also been experiencing some difficulty emptying at night. His  stream is normal during the day.  He has been having some penile pain which is intense at times but now improving.  No dysuria.     Today   Since his last visit with me he tried Myrbetriq without any improvement.  This was then switched to [facility] per his request.  He similar did not improvement and reports side effect of lightheadedness so this was also stopped.  He is back to his baseline.  He would like to discuss SNS with his neurologist prior to proceeding.  He feels that daily Cialis is now slightly less effective for his ED.      The following portions of the patient's history were reviewed and updated as appropriate: allergies, current medications, past family history, past medical history, past social history, past surgical history and problem list.    Review of Systems  Systems reviewed per the HPI and below:     History obtained from the patient     General ROS: no fevers, or chills     Gastrointestinal ROS: no abdominal pain, change in bowel habits     Musculoskeletal ROS:  no swelling to lower extremities, no back pain     Objective:     Vitals:    02/04/21 1538   BP: 133/65   Pulse: 66  Physical Exam   Constitutional:  Well-developed, well-nourished, and in no distress.   Pulmonary/Chest: Effort normal.   Neurological: Pt is alert and oriented to person, place, and time.    Psychiatric: Mood, memory, affect and judgment normal.       Lab Review   Reviewed results as listed below. Discussed findings with patient.     Urine analysis shows   Results       ** No results found for the last 24 hours. **                Radiology Review   Reviewed results as listed below. Discussed results with the patient and answered questions to the best of my ability.   None      Assessment:       Urinary frequency and nocturia  ED  H/o MS    Again discussed that unfortunately he has tried and failed many of our therapies for overactive bladder including Myrbetriq, Detrol and PTNS.  We again discussed third line  options.  He would like to discuss this with his neurologist which I think is reasonable.  For his ED I recommended increasing Cialis to 20 mg as needed.     Plan:     Patient Instructions   - Continue cialis 5 mg daily.  You may increase to 20 mg as needed for sexual activity   - Discussed SNS with your neurologist and let me know how you want to proceed  - Follow up in 6 months     Orders  No orders of the defined types were placed in this encounter.

## 2021-02-11 ENCOUNTER — Ambulatory Visit (INDEPENDENT_AMBULATORY_CARE_PROVIDER_SITE_OTHER): Payer: BLUE CROSS/BLUE SHIELD | Admitting: Urology

## 2021-03-24 ENCOUNTER — Encounter (INDEPENDENT_AMBULATORY_CARE_PROVIDER_SITE_OTHER): Payer: Self-pay | Admitting: Urology

## 2021-04-02 ENCOUNTER — Encounter (INDEPENDENT_AMBULATORY_CARE_PROVIDER_SITE_OTHER): Payer: Self-pay

## 2021-04-30 ENCOUNTER — Encounter (INDEPENDENT_AMBULATORY_CARE_PROVIDER_SITE_OTHER): Payer: Self-pay | Admitting: Cardiovascular Disease

## 2021-04-30 ENCOUNTER — Ambulatory Visit (INDEPENDENT_AMBULATORY_CARE_PROVIDER_SITE_OTHER): Payer: BLUE CROSS/BLUE SHIELD | Admitting: Cardiovascular Disease

## 2021-04-30 VITALS — BP 128/76 | HR 63 | Ht 68.0 in | Wt 177.0 lb

## 2021-04-30 DIAGNOSIS — R0602 Shortness of breath: Secondary | ICD-10-CM

## 2021-04-30 DIAGNOSIS — Z8249 Family history of ischemic heart disease and other diseases of the circulatory system: Secondary | ICD-10-CM

## 2021-04-30 DIAGNOSIS — E782 Mixed hyperlipidemia: Secondary | ICD-10-CM

## 2021-04-30 DIAGNOSIS — I10 Essential (primary) hypertension: Secondary | ICD-10-CM

## 2021-04-30 NOTE — Progress Notes (Signed)
[place] [facility] OFFICE CONSULTATION NOTE    HRT [facility] OFFICE      [place] [facility] OFFICE -CARDIOLOGY  [address]  [place] [address]  Dept: [phone]  Dept Fax: [phone]       Patient Name: [name] Young    Date of Visit:  April 30, 2021  Date of Birth: 04-Sep-1950  AGE: 70 y.o.  Medical Record #: [id number]  Requesting Physician: Fonnie Mu, MD      CHIEF COMPLAINT:  Chest Pain      HISTORY OF PRESENT ILLNESS    Mr. Sharrar is being seen today for cardiovascular evaluation at the request of Illene Labrador. Simsarian, MD. He was last seen by Dr. Clinton Sawyer of [facility] in October 2019 for shortness of breath and atypical chest discomfort. He is a pleasant 70 y.o. male who presents with intermittent diffuse chest discomfort. He denies any chest pain with exertion. He also has complaints of exertional shortness of breath with walking uphill but not on level ground. Reportedly, his previous pulmonary function tests were normal.    Regarding risk factors for CAD, he has hypertension and hyperlipidemia. He has medical history of lymphoma, well-managed with treatment. He has history of multiple sclerosis.Father had coronary disease and died aged 45 from MI. Mother had coronary disease and heart failure, and she died from cancer. Both paternal uncles had MI and died in their 106s. All 4 grandparents had history of coronary disease. Sister does not have coronary disease but has hypertension.    Exercise nuclear study from 2019 reveals no evidence of ischemia and normal LVEF. Echocardiogram from February 2020 reveals normal LVEF of 63% with trace to mild regurgitation and normal estimated pulmonary pressure.    Labs from April 22, 2021 include total cholesterol 107, LDL 43, HDL 41, and triglycerides 017 mg/dL.      PAST MEDICAL HISTORY: He has a past medical history of Arthritis, Bilateral cataracts (12/05/2010), Chronic gout, Complication of anesthesia (08/012009),  Disorder of prostate, Diverticulosis of colon without diverticulitis, GERD (gastroesophageal reflux disease), Headache, Heart murmur (02/03/1954), Herpes simplex without mention of complication, Hiatal hernia (63yrs), Hyperlipidemia, Hypertension, Irritable bowel syndrome (02/04/1999), Low back pain, Lymphoma, Malignant neoplasm of skin (2019), Multiple sclerosis (1995), and Pancreatitis (10/05/2014). 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    ALLERGIES:   Allergies   Allergen Reactions    Erythromycin Anaphylaxis and Fever    Allopurinol Hives     Family hx of problems with allopurinol       MEDICATIONS:   Current Outpatient Medications:     Acetaminophen (Tylenol) 325 MG Cap, Take by mouth as needed, Disp: , Rfl:     B Complex Vitamins (VITAMIN B COMPLEX IJ), , Disp: , Rfl:     esomeprazole (NEXIUM) 40 MG capsule, Take by mouth daily.  , Disp: , Rfl:     Febuxostat (ULORIC) 40 MG tablet, Take 40 mg by mouth every other day, Disp: , Rfl:     fenofibrate micronized (LOFIBRA) 134 MG capsule, TAKE 1 CAPSULE BY MOUTH EVERY DAY WITH FOOD, Disp: , Rfl:     Glucosamine Sulfate 750 MG CAPS, Take 500 mg by mouth 2 (two) times daily.  , Disp: , Rfl:     lisinopril (ZESTRIL) 5 MG tablet, TAKE  1 TABLET BY MOUTH EVERY DAY   STOP LISINOPRIL 10MG  TABLET., Disp: , Rfl:     Multiple Vitamin (MULTIVITAMIN) tablet, Take 1 tablet by mouth daily.  , Disp: , Rfl:     ocrelizumab (Ocrevus) 300 MG/10ML injection, Infuse 600 mg into the vein, Disp: , Rfl:     Probiotic Product ([employer] PO), Take by mouth every evening., Disp: , Rfl:     rosuvastatin (CRESTOR) 10 MG tablet, Take 10 mg by mouth daily, Disp: , Rfl:      tadalafil (CIALIS) 5 MG tablet, , Disp: , Rfl:     vitamin D3 (CHOLECALCIFEROL) 125 MCG (5000 UT) capsule, Take by mouth daily  , Disp: , Rfl:      FAMILY HISTORY: family history includes Arrhythmia in his mother; Atrial fibrillation in his mother; Cancer in his father and mother; Coronary artery disease in his father; Dementia in his father; Diabetes in his father; Heart disease in his father; Heart failure in his father, maternal grandmother, and mother; Hyperlipidemia in his father; Hypertension in his mother and sister; Myocardial Infarction (age of onset: 68) in his father; Stroke in his father.    SOCIAL HISTORY: He reports that he has never smoked. He has never used smokeless tobacco. He reports that he does not currently use alcohol. He reports that he does not use drugs.    REVIEW OF SYSTEMS:   General: Denies recent weight loss, weight gain, fever or chills or change in exercise tolerance.;   Integumentary: Denies any change in hair or nails, rashes, or skin lesions.;   Eyes: Denies diplopia, glaucoma or visual field defects.;   Ears, Nose, Throat, Mouth: Denies any hearing loss, epistaxis, hoarseness or difficulty speaking.;  Respiratory: Denies dyspnea, cough, wheezing or hemoptysis.;   Cardiovascular: Please review HPI;   Abdominal : Denies ulcer disease, hematochezia or melena.;  Musculoskeletal:Denies any venous insufficiency, arthritic symptoms or back problems.;   Neurological : Denies any recurrent strokes, TIA, or seizure disorder.;   Psychiatric: Denies any depression, substance abuse or change in cognitive functions.;   Endocrine: Denies any weight change, heat/cold intolerance, polydipsia, or polyuria;   Hematologic/Immunologic: Denies any food allergies, seasonal allergies, bleeding disorders.   All other systems reviewed and negative except as above.       PHYSICAL EXAMINATION    Visit Vitals  BP 128/76 (BP Site: Left arm, Patient Position: Sitting, Cuff Size: Medium)   Pulse 63   Ht 1.727 m  (5\' 8" )   Wt 80.3 kg (177 lb)   BMI 26.91 kg/m      General Appearance:  A well-appearing male in no acute distress.    Skin: Warm and dry to touch, no apparent skin lesions, or masses noted.  Head: Normocephalic, normal hair pattern, no masses or tenderness   Eyes: EOMS Intact, PERRL, conjunctivae and lids unremarkable.  ENT: Ears, Nose and throat reveal no gross abnormalities.  No pallor or cyanosis.  Neck: JVP normal, no carotid bruit, thyroid not enlarged   Chest: Clear to auscultation bilaterally with good air movement and respiratory effort and no wheezes, rales, or rhonchi   Cardiovascular: Regular rhythm, S1 normal, S2 normal, No S3 or S4, Apical impulse not displaced. No murmurs. No gallops or rubs detected   Abdomen: Soft, nontender, nondistended, with normoactive bowel sounds. No organomegaly.  No pulsatile masses, or bruits.   Extremities: Warm without edema. No clubbing, or cyanosis. All peripheral pulses are full and equal.   Neuro: Alert and oriented  x3. No gross motor or sensory deficits noted, affect appropriate.      ECG: Sinus rhythm, normal tracing      IMPRESSION:   Mr. Luhman is a 70 y.o. male with the following problems:    Shortness of breath. He remains very active and notes shortness of breath with only certain activities such as walking up an incline. This appears stable. Prior exercise nuclear imaging in 2019 revealed normal myocardial perfusion. His echocardiogram also confirmed normal left ventricular systolic function and no significant valvular disease. Examination and EKG in the office today are unremarkable. Prior pulmonary evaluations were also unremarkable.  Hypertension, well-controlled on lisinopril  Hyperlipidemia, on combination rosuvastatin and fenofibrate. His LDL cholesterol remains very well-controlled and his triglycerides have normalized.  Family history of early onset ischemic heart disease      RECOMMENDATIONS:    Continue current medical regimen without  change  Return visit with me in 1 year, or sooner if needed  He was encouraged to let us know if he develops worsening shortness of breath or new chest discomforts                                                 Orders Placed This Encounter   Procedures    ECG 12 lead (Normal)    Office Visit ([facility]RT [place])       No orders of the defined types were placed in this encounter.        SIGNED:    Thomas Hoff, MD       SCRIBE ATTESTATION:    This note has been prepared by Nile Dear, acting as a scribe for Dr. Melrose Nakayama, who has reviewed for accuracy and agrees with the documentation above.    I, Dr. Melrose Nakayama, agree with the above documentation, which was reviewed for accuracy and completeness. Obtaining history, performing the physical exam, and formulating the assessment and plan were performed by me. I am the provider of record.      This note was generated by the Dragon speech recognition and may contain errors or omissions not intended by the user. Grammatical errors, random word insertions, deletions, pronoun errors, and incomplete sentences are occasional consequences of this technology due to software limitations. Not all errors are caught or corrected. If there are questions or concerns about the content of this note or information contained within the body of this dictation, they should be addressed directly with the author for clarification.

## 2021-05-01 NOTE — Telephone Encounter (Signed)
Faxed endoscopy/endoscopic ultra sound report to Dr. Santiago Bumpers. Wilson pre his request

## 2021-10-03 ENCOUNTER — Encounter (INDEPENDENT_AMBULATORY_CARE_PROVIDER_SITE_OTHER): Payer: Self-pay

## 2022-03-13 ENCOUNTER — Other Ambulatory Visit: Payer: Self-pay | Admitting: Neurology

## 2022-04-14 ENCOUNTER — Encounter (INDEPENDENT_AMBULATORY_CARE_PROVIDER_SITE_OTHER): Payer: Self-pay

## 2022-05-13 ENCOUNTER — Other Ambulatory Visit: Payer: Self-pay | Admitting: Gastroenterology

## 2022-06-16 ENCOUNTER — Other Ambulatory Visit: Payer: Self-pay | Admitting: Physician Assistant

## 2022-07-10 ENCOUNTER — Encounter (INDEPENDENT_AMBULATORY_CARE_PROVIDER_SITE_OTHER): Payer: Self-pay | Admitting: Urology

## 2022-07-10 ENCOUNTER — Ambulatory Visit (INDEPENDENT_AMBULATORY_CARE_PROVIDER_SITE_OTHER): Payer: BLUE CROSS/BLUE SHIELD | Admitting: Urology

## 2022-07-10 VITALS — BP 134/81 | HR 75 | Ht 68.0 in | Wt 184.0 lb

## 2022-07-10 DIAGNOSIS — G35 Multiple sclerosis: Secondary | ICD-10-CM

## 2022-07-10 DIAGNOSIS — R351 Nocturia: Secondary | ICD-10-CM

## 2022-07-10 DIAGNOSIS — N3281 Overactive bladder: Secondary | ICD-10-CM

## 2022-07-10 LAB — URINALYSIS POC
Blood, UA POCT: NEGATIVE
POCT Urine Bilirubin: NEGATIVE
POCT Urine Glucose: NEGATIVE mg/dL
POCT Urine Ketones: NEGATIVE mg/dL
POCT Urine Nitrites: NEGATIVE
POCT Urine Urobilibogen: 0.2 mg/dL (ref 0.0–1.0)
POCT Urine pH: 6 (ref 5.0–8.0)
Protein, UR POCT: NEGATIVE mg/dL
Urine Specific Gravity POC: 1.02 (ref 1.001–1.035)
Urine leukocyte Esterase, POCT: NEGATIVE

## 2022-07-10 MED ORDER — HYOSCYAMINE SULFATE 0.125 MG PO TABS
0.1250 mg | ORAL_TABLET | Freq: Every evening | ORAL | 3 refills | Status: DC
Start: 2022-07-10 — End: 2022-10-22

## 2022-07-10 NOTE — Progress Notes (Signed)
Subjective:      Patient ID: [name] Young is a 72 y.o. male     Reason for visit:  Chief Complaint   Patient presents with    overactive bladder     Follow up        1. Overactive bladder    2. MS (multiple sclerosis)    3. Nocturia        This is a pleasant 71M.  Visit today is urinary frequency and nocturia.  He was referred by Dr. Mayra Neer for this issue. He reports voids every 2 hours and nocturia x3-4.  There is no significant urgency or UUI.   This has been ongoing for about 10 years and slightly worsening.  He feels that his stream is normal and reportedly had multiple office flow studies which have been normal.  He has tried multiple medications including Detrol, Myrbetriq and Flomax.  He felt that 4 mg of Detrol actually made his symptoms worse.  He is currently on daily Cialis.  He felt that after initially starting the Cialis symptoms were better but this was not sustained.  He has a history of MS.  He drinks 40 to 50 ounces of fluid daily and symptoms do not seem to be related to fluid intake.  Reports normal PSA with PCP.       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  He reports that after the cystoscopy he had 24 hours of complete incontinence followed by 3 weeks of perineal pain.  He was treated with a course of antibiotics for presumptive infection.  However, he had urinalysis in our office and culture which were negative.  He had a renal ultrasound which was unremarkable.   He has tried oxybutynin without significant change in his nocturia, x3-4.  His PVRs have been normal.     12/18/20  Since his last visit he completed 12 sessions of induction PTNS.  He initially felt that this was helpful but ultimately decided that this was not helpful so maintenance was not continued.  He returns today for follow up and reports worsening nocturia  now x 4-5.  He has also been experiencing some difficulty emptying at night. His stream is normal during the day.  He has been having some penile pain which is intense at times but now improving.  No dysuria.        02/04/2021  Since his last visit with me he tried Myrbetriq without any improvement.  This was then switched to [facility] per his request.  He similar did not improvement and reports side effect of lightheadedness so this was also stopped.  He is back to his baseline.  He would like to discuss SNS with his neurologist prior to proceeding.  He feels that daily Cialis is now slightly less effective for his ED.    Today   Patient returns today for routine follow up.  He reports ongoing nocturia. He discussed SNS with neurologist and was advised against it.  He continues to report nocturia x 4.  He has tried medical marijuana without improvement.  He was taking higher hycosamine for IBS.  This was not effective for IBS but did reduce his nighttime awakenings to about 2 times per night.  Cialis 5 mg daily remains mildly effective for ADD.  His PSA November 2023 was 0.8 ng/mL, per report.  The following portions of the patient's history were reviewed and updated as appropriate: allergies, current medications, past family history, past medical history, past social history, past surgical history and problem list.    Review of Systems  Systems reviewed per the HPI and below:     History obtained from the patient     General ROS: no fevers, or chills     Gastrointestinal ROS: no abdominal pain, change in bowel habits     Musculoskeletal ROS:  no swelling to lower extremities, no back pain     Objective:     Vitals:    07/10/22 1138   BP: 134/81   Pulse: 75         Physical Exam   Constitutional:  Well-developed, well-nourished, and in no distress.   Pulmonary/Chest: Effort normal.   Neurological: Pt is alert and oriented to person, place, and time.    Psychiatric: Mood, memory, affect and judgment normal.        Lab Review   Reviewed results as listed below. Discussed findings with patient.     Urine analysis shows   Results       Procedure Component Value Units Date/Time    Urinalysis POC [[id number]] Collected: 07/10/22 1130     Updated: 07/10/22 1132     POCT Urine Color Yellow     POCT Urine Clarity Clear     POCT Urine pH 6.0     Urine leukocyte Esterase, POCT Negative     POCT Urine Nitrites Negative     Protein, UR POCT Negative mg/dL      POCT Urine Glucose Negative mg/dL      POCT Urine Ketones Negative mg/dL      POCT Urine Urobilibogen 0.2 mg/dL      POCT Urine Bilirubin Negative     Blood, UA POCT Negative     Urine Specific Gravity POC 1.020                Radiology Review   Reviewed results as listed below. Discussed results with the patient and answered questions to the best of my ability.         Assessment:     Urinary frequency and nocturia  ED  H/o MS     Plan:     Patient Instructions   - We discussed your ongoing issues with nocturia   - Start hyocyamine 0.125 mg nightly as this has been previously helpful   - Continue cialis 5mg  daily   - Follow up in 6 months month     Orders  Orders Placed This Encounter   Procedures    Urinalysis POC

## 2022-07-10 NOTE — Patient Instructions (Addendum)
-   We discussed your ongoing issues with nocturia   - Start hyocyamine 0.125 mg nightly as this has been previously helpful   - Continue cialis 5mg  daily   - Follow up in 6 months month

## 2022-09-30 ENCOUNTER — Encounter (INDEPENDENT_AMBULATORY_CARE_PROVIDER_SITE_OTHER): Payer: Self-pay | Admitting: Urology

## 2022-10-22 ENCOUNTER — Ambulatory Visit (INDEPENDENT_AMBULATORY_CARE_PROVIDER_SITE_OTHER): Payer: BLUE CROSS/BLUE SHIELD | Admitting: Cardiovascular Disease

## 2022-10-22 ENCOUNTER — Encounter (INDEPENDENT_AMBULATORY_CARE_PROVIDER_SITE_OTHER): Payer: Self-pay | Admitting: Cardiovascular Disease

## 2022-10-22 VITALS — BP 138/80 | HR 99 | Wt 183.0 lb

## 2022-10-22 DIAGNOSIS — E785 Hyperlipidemia, unspecified: Secondary | ICD-10-CM

## 2022-10-22 DIAGNOSIS — I10 Essential (primary) hypertension: Secondary | ICD-10-CM

## 2022-10-22 DIAGNOSIS — R0602 Shortness of breath: Secondary | ICD-10-CM

## 2022-10-22 DIAGNOSIS — I2510 Atherosclerotic heart disease of native coronary artery without angina pectoris: Secondary | ICD-10-CM

## 2022-10-22 LAB — ECG 12-LEAD
Atrial Rate: 99 {beats}/min
IHS MUSE NARRATIVE AND IMPRESSION: NORMAL
P Axis: 32 degrees
P-R Interval: 162 ms
Q-T Interval: 360 ms
QRS Duration: 76 ms
QTC Calculation (Bezet): 462 ms
R Axis: 4 degrees
T Axis: 18 degrees
Ventricular Rate: 99 {beats}/min

## 2022-10-22 NOTE — Progress Notes (Signed)
[facility] CARDIOLOGY OFFICE PROGRESS NOTE    HRT [facility] OFFICE      [facility] OFFICE -CARDIOLOGY  [address]  [place] [address]  Dept: [phone]  Dept Fax: [phone]       Patient Name: [name] Young    Date of Visit: October 22, 2022  Date of Birth: 1951-05-12  AGE: 72 y.o.  Medical Record #: [id number]  Requesting Physician: Fonnie Mu, MD      CHIEF COMPLAINT: Coronary artery calcification      HISTORY OF PRESENT ILLNESS    [name] is being seen today for follow up. He has no complaints of chest discomfort. He exercises regularly walking his dog 4 to 5 days per week. He walks 10,000 steps every time he does walk his dog. He does have some exertional dyspnea, and his exercise tolerance has somewhat decreased over the past 6 months. He has not been aware of palpitations. His prior cardiac tests include CT cardiac scoring with coronary calcium score of 336 in September 2020 and normal stress nuclear imaging in August 2019.     Fasting lipid profile from April 21, 2022 reveals total cholesterol 113, LDL 45, HDL 46, and triglycerides 295 mg/dL. BUN was 21 mg/dL and creatinine was 2.84 mg/dL. Electrolytes were within normal range.      PAST MEDICAL HISTORY: He has a past medical history of Arthritis, Bilateral cataracts (12/05/2010), Chronic gout, Complication of anesthesia (08/012009), Disorder of prostate, Diverticulosis of colon without diverticulitis, GERD (gastroesophageal reflux disease), Headache, Heart murmur (02/03/1954), Herpes simplex without mention of complication, Hiatal hernia (75yrs), Hyperlipidemia, Hypertension, Irritable bowel syndrome (02/04/1999), Low back pain, Lymphoma, Malignant neoplasm of skin (2019), Multiple sclerosis (1995), and Pancreatitis (10/05/2014). 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    ALLERGIES:   Allergies   Allergen Reactions    Erythromycin Anaphylaxis and Fever    Allopurinol Hives     Family hx of problems with allopurinol    Icosapent Ethyl Diarrhea    Monosodium Glutamate Headaches    Pollen Extract        MEDICATIONS:   Current Outpatient Medications:     Acetaminophen (Tylenol) 325 MG Cap, Take by mouth as needed, Disp: , Rfl:     B Complex Vitamins (VITAMIN B COMPLEX IJ), , Disp: , Rfl:     esomeprazole (NexIUM) 20 MG capsule, Take 1 capsule (20 mg) by mouth every morning before breakfast, Disp: , Rfl:     Febuxostat (ULORIC) 40 MG tablet, Take 1 tablet (40 mg) by mouth every other day, Disp: , Rfl:     fenofibrate micronized (LOFIBRA) 134 MG capsule, TAKE 1 CAPSULE BY MOUTH EVERY DAY WITH FOOD, Disp: , Rfl:     Glucosamine Sulfate 750 MG CAPS, Take 0.6667 capsules (500 mg) by mouth 2 (two) times daily, Disp: , Rfl:     lisinopril (ZESTRIL) 5 MG tablet, TAKE 1 TABLET BY MOUTH EVERY DAY   STOP LISINOPRIL 10MG  TABLET., Disp: , Rfl:     Multiple Vitamin (MULTIVITAMIN) tablet, Take 1 tablet by mouth daily, Disp: , Rfl:     ocrelizumab (Ocrevus) 300 MG/10ML injection, Infuse 20 mLs (600 mg) into the vein, Disp: , Rfl:     Probiotic Product (PH[facility] [employer] PO), Take by mouth  every evening., Disp: , Rfl:     rosuvastatin (CRESTOR) 10 MG tablet, Take 1 tablet (10 mg) by mouth daily, Disp: , Rfl:     tadalafil (CIALIS) 5 MG tablet, Take 1 tablet (5 mg) by mouth daily, Disp: , Rfl:     UNABLE TO FIND, Med Name: IBGUARD - over the counter, Disp: , Rfl:     vitamin D3 (CHOLECALCIFEROL) 125 MCG (5000 UT) capsule, Take by mouth daily  , Disp: , Rfl:      FAMILY HISTORY: family history includes Arrhythmia in his mother; Atrial fibrillation in his mother; Cancer in his father and mother; Coronary artery disease in  his father; Dementia in his father; Diabetes in his father; Heart disease in his father; Heart failure in his father, maternal grandmother, and mother; Hyperlipidemia in his father; Hypertension in his mother and sister; Myocardial Infarction (age of onset: 24) in his father; Stroke in his father.    SOCIAL HISTORY: He reports that he has never smoked. He has never used smokeless tobacco. He reports that he does not currently use alcohol. He reports that he does not use drugs.      PHYSICAL EXAMINATION    Visit Vitals  BP 138/80 (BP Site: Left arm, Patient Position: Sitting, Cuff Size: Medium)   Pulse 99   Wt 83 kg (183 lb)   BMI 27.83 kg/m     General Appearance:  A well-appearing male in no acute distress.    Skin: Warm and dry to touch, no apparent skin lesions, or masses noted.  Head: Normocephalic, normal hair pattern, no masses or tenderness   Eyes: EOMS Intact, PERRL, conjunctivae and lids unremarkable.  ENT: Ears, Nose and throat reveal no gross abnormalities.  No pallor or cyanosis.  Neck: JVP normal, no carotid bruit, thyroid not enlarged   Chest: Clear to auscultation bilaterally with good air movement and respiratory effort and no wheezes, rales, or rhonchi   Cardiovascular: Regular rhythm, S1 normal, S2 normal, No S3 or S4, Apical impulse not displaced. No murmurs. No gallops or rubs detected   Abdomen: Soft, nontender, nondistended, with normoactive bowel sounds. No organomegaly.  No pulsatile masses, or bruits.   Extremities: Warm without edema. No clubbing, or cyanosis. All peripheral pulses are full and equal.   Neuro: Alert and oriented x3. No gross motor or sensory deficits noted, affect appropriate.      ECG: Sinus rhythm, normal tracing      IMPRESSION:   [name] Young is a 72 y.o. male with the following problems:    Coronary artery calcification with a coronary calcium score of 336 in 2020, consistent with a high plaque burden. He continues to deny chest discomforts, but does have exertional  dyspnea. His cardiac exam and EKG in the office today are unremarkable. We discussed the options for further evaluation. A stress PET/CT scan was recommended for assessment of myocardial perfusion, as well as myocardial blood flow reserve and left ventricular function.  Hypertension, well-controlled on lisinopril  Hyperlipidemia, on combination rosuvastatin and fenofibrate. His LDL cholesterol remains very well-controlled and his triglycerides have normalized.  Family history of early onset ischemic heart disease      RECOMMENDATIONS:    Stress PET/CT for further assessment of elevated coronary calcium score and shortness of breath  Continue current medical regimen without change  Return visit with me after the above if needed  Orders Placed This Encounter   Procedures    PETCT Stress MPI    PETCT Dual Read    ECG 12 lead (Normal)       No orders of the defined types were placed in this encounter.          SIGNED:    Thomas Hoff, MD       SCRIBE ATTESTATION:    This note has been prepared by Nile Dear, acting as a scribe for Dr. Melrose Nakayama, who has reviewed for accuracy and agrees with the documentation above.    I, Dr. Melrose Nakayama, agree with the above documentation, which was reviewed for accuracy and completeness. Obtaining history, performing the physical exam, and formulating the assessment and plan were performed by me. I am the provider of record.      This note was generated by the Dragon speech recognition and may contain errors or omissions not intended by the user. Grammatical errors, random word insertions, deletions, pronoun errors, and incomplete sentences are occasional consequences of this technology due to software limitations. Not all errors are caught or corrected. If there are questions or concerns about the content of this note or information contained within the body of this dictation, they should be addressed directly with the author for  clarification.

## 2022-10-23 ENCOUNTER — Encounter (INDEPENDENT_AMBULATORY_CARE_PROVIDER_SITE_OTHER): Payer: Self-pay

## 2022-10-23 ENCOUNTER — Other Ambulatory Visit (INDEPENDENT_AMBULATORY_CARE_PROVIDER_SITE_OTHER): Payer: Self-pay

## 2022-10-23 DIAGNOSIS — I2510 Atherosclerotic heart disease of native coronary artery without angina pectoris: Secondary | ICD-10-CM

## 2022-11-11 ENCOUNTER — Inpatient Hospital Stay
Admission: RE | Admit: 2022-11-11 | Discharge: 2022-11-11 | Disposition: A | Discharge status: Home / Self Care | Payer: BLUE CROSS/BLUE SHIELD | Source: Ambulatory Visit | Attending: Cardiovascular Disease | Admitting: Cardiovascular Disease

## 2022-11-11 ENCOUNTER — Encounter (INDEPENDENT_AMBULATORY_CARE_PROVIDER_SITE_OTHER): Payer: Self-pay | Admitting: Internal Medicine

## 2022-11-11 DIAGNOSIS — E7849 Other hyperlipidemia: Secondary | ICD-10-CM

## 2022-11-11 DIAGNOSIS — N3281 Overactive bladder: Secondary | ICD-10-CM

## 2022-11-11 DIAGNOSIS — I2510 Atherosclerotic heart disease of native coronary artery without angina pectoris: Secondary | ICD-10-CM | POA: Insufficient documentation

## 2022-11-11 DIAGNOSIS — I10 Essential (primary) hypertension: Secondary | ICD-10-CM

## 2022-11-11 DIAGNOSIS — N528 Other male erectile dysfunction: Secondary | ICD-10-CM

## 2022-11-11 DIAGNOSIS — N4340 Spermatocele of epididymis, unspecified: Secondary | ICD-10-CM

## 2022-11-11 DIAGNOSIS — R351 Nocturia: Secondary | ICD-10-CM

## 2022-11-11 DIAGNOSIS — R0609 Other forms of dyspnea: Secondary | ICD-10-CM | POA: Insufficient documentation

## 2022-11-11 DIAGNOSIS — G35 Multiple sclerosis: Secondary | ICD-10-CM

## 2022-11-11 LAB — NM MYOCARDIAL PERFUSION SPECT W STRESS AND REST
Stress LV Ejection Fraction: 67
Summary: NEGATIVE
Summary: NORMAL
Summary: NORMAL

## 2022-11-11 MED ORDER — TECHNETIUM TC 99M TETROFOSMIN IV KIT
10.0000 | PACK | Freq: Once | INTRAVENOUS | Status: AC | PRN
Start: 2022-11-11 — End: 2022-11-11
  Administered 2022-11-11: 10 via INTRAVENOUS
  Filled 2022-11-11: qty 100

## 2022-11-11 MED ORDER — TECHNETIUM TC 99M TETROFOSMIN IV KIT
35.0000 | PACK | Freq: Once | INTRAVENOUS | Status: AC | PRN
Start: 2022-11-11 — End: 2022-11-11
  Administered 2022-11-11: 35 via INTRAVENOUS
  Filled 2022-11-11: qty 100

## 2023-01-20 ENCOUNTER — Ambulatory Visit (INDEPENDENT_AMBULATORY_CARE_PROVIDER_SITE_OTHER): Payer: BLUE CROSS/BLUE SHIELD | Admitting: Cardiovascular Disease

## 2023-01-22 ENCOUNTER — Encounter (INDEPENDENT_AMBULATORY_CARE_PROVIDER_SITE_OTHER): Payer: Self-pay | Admitting: Urology

## 2023-01-22 ENCOUNTER — Ambulatory Visit (INDEPENDENT_AMBULATORY_CARE_PROVIDER_SITE_OTHER): Payer: BLUE CROSS/BLUE SHIELD | Admitting: Urology

## 2023-01-22 VITALS — BP 145/74 | HR 66 | Temp 97.7°F | Wt 185.0 lb

## 2023-01-22 DIAGNOSIS — G35 Multiple sclerosis: Secondary | ICD-10-CM

## 2023-01-22 DIAGNOSIS — R351 Nocturia: Secondary | ICD-10-CM

## 2023-01-22 DIAGNOSIS — N528 Other male erectile dysfunction: Secondary | ICD-10-CM

## 2023-01-22 LAB — URINALYSIS POCT
Urine Bilirubin POCT: NEGATIVE
Urine Blood POCT: NEGATIVE
Urine Glucose POCT: NEGATIVE mg/dL
Urine Ketones POCT: NEGATIVE mg/dL
Urine Leukocyte Esterase POCT: NEGATIVE
Urine Nitrites POCT: NEGATIVE
Urine Protein POCT: NEGATIVE mg/dL
Urine Specific Gravity POCT: 1.025
Urine Urobilinogen POCT: 0.2 mg/dL (ref 0.2–2.0)
Urine pH POCT: 6 (ref 5.0–8.0)

## 2023-01-22 NOTE — Progress Notes (Signed)
Subjective:      Patient ID: [name] Young is a 72 y.o. male     Reason for visit:  Chief Complaint   Patient presents with    Overactive bladder     F/u       1. Nocturia    2. Other male erectile dysfunction    3. MS (multiple sclerosis)        This is a pleasant 82M.  Visit today is urinary frequency and nocturia.  He was referred by Dr. Mayra Neer for this issue. He reports voids every 2 hours and nocturia x3-4.  There is no significant urgency or UUI.   This has been ongoing for about 10 years and slightly worsening.  He feels that his stream is normal and reportedly had multiple office flow studies which have been normal.  He has tried multiple medications including Detrol, Myrbetriq and Flomax.  He felt that 4 mg of Detrol actually made his symptoms worse.  He is currently on daily Cialis.  He felt that after initially starting the Cialis symptoms were better but this was not sustained.  He has a history of MS.  He drinks 40 to 50 ounces of fluid daily and symptoms do not seem to be related to fluid intake.  Reports normal PSA with PCP.       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  He reports that after the cystoscopy he had 24 hours of complete incontinence followed by 3 weeks of perineal pain.  He was treated with a course of antibiotics for presumptive infection.  However, he had urinalysis in our office and culture which were negative.  He had a renal ultrasound which was unremarkable.   He has tried oxybutynin without significant change in his nocturia, x3-4.  His PVRs have been normal.     12/18/20  Since his last visit he completed 12 sessions of induction PTNS.  He initially felt that this was helpful but ultimately decided that this was not helpful so maintenance was not continued.  He returns today for follow up and reports worsening  nocturia now x 4-5.  He has also been experiencing some difficulty emptying at night. His stream is normal during the day.  He has been having some penile pain which is intense at times but now improving.  No dysuria.         02/04/2021  Since his last visit with me he tried Myrbetriq without any improvement.  This was then switched to [facility] per his request.  He similar did not improvement and reports side effect of lightheadedness so this was also stopped.  He is back to his baseline.  He would like to discuss SNS with his neurologist prior to proceeding.  He feels that daily Cialis is now slightly less effective for his ED.     07/10/2022   Patient returns today for routine follow up.  He reports ongoing nocturia. He discussed SNS with neurologist and was advised against it.  He continues to report nocturia x 4.  He has tried medical marijuana without improvement.  He was taking higher hycosamine for IBS.  This was not effective for IBS but did reduce his nighttime awakenings to about 2 times per night.  Cialis 5 mg daily remains mildly effective for ED.  His PSA November 2023 was 0.8 ng/mL, per report.  Today   Patinet returns for 6 months follow up.  He reports ongoing daytime frequency 12-14 times per day and nocturia x 4.  His flow is slightly diminished and having slightly more urgency.  He has continued daily cialis 5mg  with stable ED.         The following portions of the patient's history were reviewed and updated as appropriate: allergies, current medications, past family history, past medical history, past social history, past surgical history and problem list.    Review of Systems  Systems reviewed per the HPI and below:     History obtained from the patient     General ROS: no fevers, or chills     Gastrointestinal ROS: no abdominal pain, change in bowel habits     Musculoskeletal ROS:  no swelling to lower extremities, no back pain     Objective:     Vitals:    01/22/23 1043   BP: 145/74   Pulse: 66    Temp:          Physical Exam   Constitutional:  Well-developed, well-nourished, and in no distress.   Pulmonary/Chest: Effort normal.   Neurological: Pt is alert and oriented to person, place, and time.    Psychiatric: Mood, memory, affect and judgment normal.       Lab Review   Reviewed results as listed below. Discussed findings with patient.     Urine analysis shows   Results       Procedure Component Value Units Date/Time    Urinalysis POCT [[id number]] Collected: 01/22/23 1038    Specimen: Urine, Clean Catch Updated: 01/22/23 1039     Urine Color POCT Yellow     Urine Clarity POCT Clear     Urine pH POCT 6.0     Urine Leukocyte Esterase POCT Negative     Urine Nitrites POCT Negative     Urine Protein POCT Negative mg/dL      Urine Glucose POCT Negative mg/dL      Urine Ketones POCT Negative mg/dL      Urine Urobilinogen POCT 0.2 mg/dL      Urine Bilirubin POCT Negative     Urine Blood POCT Negative     Urine Specific Gravity POCT 1.025                Radiology Review   Reviewed results as listed below. Discussed results with the patient and answered questions to the best of my ability.         Assessment:     Urinary frequency and nocturia  H/o MS    Patient has tried and failed flomax, detrol, myrbetriq and gemtessa.  He also did not respond to PTNS.  We discussed an interstim trial and he will think about it.  Otherwise continue cialis and FU 1 year.    Plan:     Patient Instructions   - IWe discussed your ongoing nocturia   - Continue cialis 5mg  daily   - We discussed interstim as an option and you will think about it   - Follow up in 1 year or sooner for any new issues or concerns     Orders  No orders of the defined types were placed in this encounter.        This note was generated by the Epic EMR system/ Dragon speech recognition and may contain inherent errors or omissions not intended by the user. Grammatical errors, random word insertions, deletions, pronoun errors and incomplete sentences  are  occasional consequences of this technology due to software limitations. Not all errors are caught or corrected. If there are questions or concerns about the content of this note or information contained within the body of this dictation they should be addressed directly with the author for clarification     99214 (30 minutes)  I spent more than 30 total  minutes for the following activities:  Interviewing/examining the patient  Reviewing relevant notes  Reviewing/interpreting/ordering tests and medications  Counseling and educating the patient/family/caregiver with care coordination  Documenting/charting clinical information in EPIC.

## 2023-01-22 NOTE — Patient Instructions (Addendum)
-   We discussed your ongoing nocturia and daytime frequency   - Continue cialis 5mg  daily   - We discussed interstim as an option and you will think about it   - Complete voiding diary and send it back to me   - Follow up in 1 year or sooner for any new issues or concerns

## 2023-02-22 ENCOUNTER — Emergency Department: Payer: BLUE CROSS/BLUE SHIELD

## 2023-02-22 ENCOUNTER — Emergency Department
Admission: EM | Admit: 2023-02-22 | Discharge: 2023-02-22 | Disposition: A | Discharge status: Home / Self Care | Payer: BLUE CROSS/BLUE SHIELD | Attending: Emergency Medicine | Admitting: Emergency Medicine

## 2023-02-22 DIAGNOSIS — R109 Unspecified abdominal pain: Secondary | ICD-10-CM | POA: Insufficient documentation

## 2023-02-22 LAB — URINALYSIS WITH REFLEX TO MICROSCOPIC EXAM - REFLEX TO CULTURE
Urine Bilirubin: NEGATIVE
Urine Blood: NEGATIVE
Urine Glucose: NEGATIVE
Urine Ketones: NEGATIVE mg/dL
Urine Leukocyte Esterase: NEGATIVE
Urine Nitrite: NEGATIVE
Urine Protein: NEGATIVE
Urine Specific Gravity: 1.025 (ref 1.001–1.035)
Urine Urobilinogen: NORMAL mg/dL (ref 0.2–2.0)
Urine pH: 6 (ref 5.0–8.0)

## 2023-02-22 LAB — LAB USE ONLY - MANUAL DIFFERENTIAL
Absolute Atypical Lymphocytes: 0.21 10*3/uL — ABNORMAL HIGH (ref <=0.00)
Absolute Bands: 0 10*3/uL (ref 0.00–1.00)
Absolute Basophils: 0.1 10*3/uL — ABNORMAL HIGH (ref 0.00–0.08)
Absolute Eosinophils: 0.1 10*3/uL (ref 0.00–0.44)
Absolute Lymphocytes: 0.16 10*3/uL — ABNORMAL LOW (ref 0.42–3.22)
Absolute Monocytes: 0.93 10*3/uL — ABNORMAL HIGH (ref 0.21–0.85)
Absolute Neutrophils: 3.68 10*3/uL (ref 1.10–6.33)
Atypical Lympocytes %: 4 %
Bands %: 0 %
Basophils %: 2 %
Eosinophils %: 2 %
Lymphocytes %: 3 %
Monocytes %: 18 %
Neutrophils %: 71 %
Platelet Estimate: NORMAL
RBC Morphology: NORMAL

## 2023-02-22 LAB — LAB USE ONLY - CBC WITH DIFFERENTIAL
Absolute nRBC: 0 10*3/uL (ref <=0.00)
Hematocrit: 41.2 % (ref 37.6–49.6)
Hemoglobin: 14.1 g/dL (ref 12.5–17.1)
MCH: 28.5 pg (ref 25.1–33.5)
MCHC: 34.2 g/dL (ref 31.5–35.8)
MCV: 83.2 fL (ref 78.0–96.0)
MPV: 9.1 fL (ref 8.9–12.5)
Platelet Count: 286 10*3/uL (ref 142–346)
Preliminary Absolute Neutrophil Count: 3.28 10*3/uL (ref 1.10–6.33)
RBC: 4.95 10*6/uL (ref 4.20–5.90)
RDW: 13 % (ref 11–15)
WBC: 5.18 10*3/uL (ref 3.10–9.50)
nRBC %: 0 /100 WBC (ref <=0.0)

## 2023-02-22 LAB — COMPREHENSIVE METABOLIC PANEL
ALT: 29 U/L (ref 0–55)
AST (SGOT): 24 U/L (ref 5–41)
Albumin/Globulin Ratio: 1.3 (ref 0.9–2.2)
Albumin: 4.2 g/dL (ref 3.5–5.0)
Alkaline Phosphatase: 45 U/L (ref 37–117)
Anion Gap: 9 (ref 5.0–15.0)
BUN: 20 mg/dL (ref 9–28)
Bilirubin, Total: 0.4 mg/dL (ref 0.2–1.2)
CO2: 25 mEq/L (ref 17–29)
Calcium: 10.5 mg/dL — ABNORMAL HIGH (ref 7.9–10.2)
Chloride: 105 mEq/L (ref 99–111)
Creatinine: 1 mg/dL (ref 0.5–1.5)
GFR: >60 mL/min/{1.73_m2} (ref >=60.0)
Globulin: 3.2 g/dL (ref 2.0–3.6)
Glucose: 98 mg/dL (ref 70–100)
Potassium: 3.8 mEq/L (ref 3.5–5.3)
Protein, Total: 7.4 g/dL (ref 6.0–8.3)
Sodium: 139 mEq/L (ref 135–145)

## 2023-02-22 LAB — LAB USE ONLY - URINE GRAY CULTURE HOLD TUBE

## 2023-02-22 LAB — LIPASE: Lipase: 86 U/L — ABNORMAL HIGH (ref 8–78)

## 2023-02-22 NOTE — ED Triage Notes (Signed)
Left flank pain since Thursday. No UA sx. Nothing taken today

## 2023-02-22 NOTE — ED Provider Notes (Signed)
EMERGENCY DEPARTMENT HISTORY AND PHYSICAL EXAM    Date: 02/22/2023   Patient Name: [name] Young  Attending Physician: Alphonzo Severance, DO   Advanced Practice Provider: Neil Crouch, PA    History of Presenting Illness     History Provided By: Patient  Interpreter: None    [name] Young is a 72 y.o. male with PMH of htn, hld, MS, spermatocele, diverticulosis, hiatal hernia, lower back pain, lymphoma, arthritis, IBS, pancreatitis (per epic chart review), presenting to the ED with left upper quadrant abdominal pain since Thursday.  Denies any injury or trauma and does not recall any inciting events.  States that it has been consistently present since Thursday.  Pain is mostly associated with movement and denies any pain at rest.  Has mild nausea and has been having loose stools over the past few days but he attributes this to recently taking Paxlovid for COVID last week.  States that he took an ibuprofen last night with mild improvement in his symptoms.  Denies dysuria, hematuria, vomiting, constipation, chest pain, or shortness of breath.    PCP: Fonnie Mu, MD  SPECIALISTS:    Immunization History   Administered Date(s) Administered   . COVID-19 mRNA 2023-2024 vaccine 12 years and above [employer]) 30 mcg/0.3 mL 03/27/2022   . COVID-19 mRNA BIVALENT vaccine 12 years and above [employer]) 30 mcg/0.3 mL 03/19/2021, 10/29/2021   . COVID-19 mRNA MONOVALENT vaccine PRIMARY SERIES 12 years and above [employer]) 30 mcg/0.3 mL (DILUTE BEFORE USE) 02/17/2020       No LMP for male patient.    Current Medications[1]  Review of Systems   As Per HPI  Physical Exam   Pulse 83  BP 175/83  Resp 16  SpO2 98 %  Temp 98.2 F (36.8 C)    Pulse Oximetry Analysis - Normal SpO2: 98 % on RA    Physical Exam  Vitals and nursing note reviewed.   Constitutional:       General: He is not in acute distress.     Appearance: Normal appearance. He is not ill-appearing or toxic-appearing.   HENT:      Head: Normocephalic and atraumatic.       Mouth/Throat:      Pharynx: Oropharynx is clear.   Eyes:      General: No scleral icterus.     Conjunctiva/sclera: Conjunctivae normal.   Cardiovascular:      Rate and Rhythm: Normal rate and regular rhythm.   Pulmonary:      Effort: Pulmonary effort is normal.      Breath sounds: Normal breath sounds.   Abdominal:      General: Abdomen is flat. There is no distension.      Palpations: Abdomen is soft. There is no mass.      Tenderness: There is no abdominal tenderness. There is no right CVA tenderness, left CVA tenderness, guarding or rebound.      Hernia: No hernia is present.   Skin:     General: Skin is warm and dry.      Capillary Refill: Capillary refill takes less than 2 seconds.   Neurological:      Mental Status: He is alert.      Comments: Ambulatory with steady gait, clear and fluent speech, cranial nerves grossly intact, moving all extremities equally   Psychiatric:         Mood and Affect: Mood normal.         Behavior: Behavior normal.  Past History     Past Medical History:  Past Medical History:   Diagnosis Date   . Arthritis     Knees   . Bilateral cataracts 12/05/2010    Surgery on both eyes   . Chronic gout    . Complication of anesthesia 08/012009    Takes longer than usual for me to recover, Disorientation x 1 hour, Slight Memory loss 1x    . Disorder of prostate     Enlarged.   . Diverticulosis of colon without diverticulitis    . GERD (gastroesophageal reflux disease)    . Headache     Occasional   . Heart murmur 02/03/1954    Had murmur since I was a child   . Herpes simplex without mention of complication    . Hiatal hernia 62yrs    Controlled   . Hyperlipidemia    . Hypertension     controlled, Checks 1x week, Avg 135/80   . Irritable bowel syndrome 02/04/1999    Controlled   . Low back pain    . Lymphoma     Dx 06/2018   . 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 Multiple sclerosis 1995   . Pancreatitis 10/05/2014    [facility]on    Past Surgical History:  Past Surgical  History:   Procedure Laterality Date   . 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 COLONOSCOPY, DIAGNOSTIC (SCREENING)  2015    x 5   . COLONOSCOPY, DIAGNOSTIC (SCREENING) N/A 02/13/2016    Procedure: COLONOSCOPY;  Surgeon: Homero Fellers, MD;  Location: [facility] OR;  Service: Gastroenterology;  Laterality: N/A;  COLONOSCOPY W/IVA   . COLONOSCOPY, DIAGNOSTIC (SCREENING) N/A 06/09/2018    Procedure: COLONOSCOPY;  Surgeon: Homero Fellers, MD;  Location: [facility] SURG[facility] OR;  Service: Gastroenterology;  Laterality: N/A;  COLON W/IVA   . EUS (ENDOSCOPIC ULTRASOUND)  2016   . EUS (ENDOSCOPIC ULTRASOUND)  10/2016   . EYE SURGERY Bilateral 2012    bil cataracts removed 6/12   . SPERMATOCELECTOMY Left 04/02/2017    Procedure: SPERMATOCELECTOMY;  Surgeon: Tescher, Hoyle Sauer, MD;  Location: [id number];  Service: Urology;  Laterality: Left;   . TONSILLECTOMY  1954    at age 43      Family/Social History:  He reports that he has never smoked. He has never used smokeless tobacco. He reports that he does not currently use alcohol. He reports that he does not use drugs.  Family History   Problem Relation Age of Onset   . Cancer Mother    . Arrhythmia Mother    . Atrial fibrillation Mother    . Hypertension Mother    . Heart failure Mother    . Heart disease Father    . Myocardial Infarction Father 25   . Dementia Father    . Cancer Father    . Hyperlipidemia Father    . Diabetes Father    . Coronary artery disease Father    . Heart failure Father    . Stroke Father    . Heart failure Maternal Grandmother    . Hypertension Sister     Allergies:  Allergies[2]   Listed Medications on Arrival:  Home Medications       Med List Status: Complete Set By: Kandyce Rud, RN at 02/22/2023 11:46 AM  Acetaminophen (Tylenol) 325 MG Cap     Take by mouth as needed     B Complex Vitamins (VITAMIN B COMPLEX IJ)          esomeprazole (NexIUM) 20 MG capsule     Take 1 capsule (20  mg) by mouth every morning before breakfast     famotidine (PEPCID) 40 MG tablet          Febuxostat (ULORIC) 40 MG tablet     Take 1 tablet (40 mg) by mouth every other day     fenofibrate micronized (LOFIBRA) 134 MG capsule     TAKE 1 CAPSULE BY MOUTH EVERY DAY WITH FOOD     Glucosamine Sulfate 500 MG Cap     Take 1 capsule (500 mg) by mouth 2 (two) times daily     ibuprofen (ADVIL) 200 MG tablet     Take 1 tablet (200 mg) by mouth every 6 (six) hours as needed for Pain     lisinopril (ZESTRIL) 5 MG tablet     TAKE 1 TABLET BY MOUTH EVERY DAY   STOP LISINOPRIL 10MG  TABLET.     Multiple Vitamin (MULTIVITAMIN) tablet     Take 1 tablet by mouth daily     ocrelizumab (Ocrevus) 300 MG/10ML injection     Infuse 20 mLs (600 mg) into the vein Twice a year     Probiotic Product (PHILLIPS COLON HEALTH PO)     Take by mouth every evening.     rosuvastatin (CRESTOR) 10 MG tablet     Take 1 tablet (10 mg) by mouth daily     tadalafil (CIALIS) 5 MG tablet     Take 1 tablet (5 mg) by mouth daily     UNABLE TO FIND     Med Name: IBGUARD - over the counter     vitamin D (CHOLECALCIFEROL) 25 MCG (1000 UT) tablet     Take 2 tablets (2,000 Units) by mouth daily     vitamin D3 (CHOLECALCIFEROL) 125 MCG (5000 UT) capsule     Take by mouth daily        Patient not taking: Reported on 11/11/2022           Primary Care Provider: Fonnie Mu, MD     Diagnostic Study Results   Labs -     Results       Procedure Component Value Units Date/Time    Lipase [[id number]]  (Abnormal) Collected: 02/22/23 1137    Specimen: Blood, Venous Updated: 02/22/23 1329     Lipase 86 U/L     CBC with Differential (Order) [[id number]]  (Abnormal) Collected: 02/22/23 1137    Specimen: Blood, Venous Updated: 02/22/23 1314    Narrative:      The following orders were created for panel order CBC with Differential (Order).  Procedure                               Abnormality         Status                     ---------                               -----------          ------  CBC with Differential (C.Marland KitchenMarland Kitchen[[id number]]  Normal              Final result               Manual Differential (Com.Marland KitchenMarland Kitchen[[id number]]  Abnormal            Final result                 Please view results for these tests on the individual orders.    CBC with Differential (Component) [[id number]]  (Normal) Collected: 02/22/23 1137    Specimen: Blood, Venous Updated: 02/22/23 1314     WBC 5.18 x10 3/uL      Hemoglobin 14.1 g/dL      Hematocrit 08.6 %      Platelet Count 286 x10 3/uL      MPV 9.1 fL      RBC 4.95 x10 6/uL      MCV 83.2 fL      MCH 28.5 pg      MCHC 34.2 g/dL      RDW 13 %      nRBC % 0.0 /100 WBC      Absolute nRBC 0.00 x10 3/uL      Preliminary Absolute Neutrophil Count 3.28 x10 3/uL     Manual Differential (Component) [[id number]]  (Abnormal) Collected: 02/22/23 1137    Specimen: Blood, Venous Updated: 02/22/23 1314     Neutrophils % 71 %      Lymphocytes % 3 %      Monocytes % 18 %      Eosinophils % 2 %      Basophils % 2 %      Bands % 0 %      Atypical Lympocytes % 4 %      Absolute Neutrophils 3.68 x10 3/uL      Absolute Lymphocytes 0.16 x10 3/uL      Absolute Monocytes 0.93 x10 3/uL      Absolute Eosinophils 0.10 x10 3/uL      Absolute Basophils 0.10 x10 3/uL      Absolute Bands 0.00 x10 3/uL      Absolute Atypical Lymphocytes 0.21 x10 3/uL      RBC Morphology Normal     Platelet Estimate Normal    Urine [employer] Tube [[id number]] Collected: 02/22/23 1132    Specimen: Urine, Clean Catch Updated: 02/22/23 1300     Extra Tube Hold for add-ons.    Comprehensive Metabolic Panel [[id number]]  (Abnormal) Collected: 02/22/23 1137    Specimen: Blood, Venous Updated: 02/22/23 1213     Glucose 98 mg/dL      BUN 20 mg/dL      Creatinine 1.0 mg/dL      Sodium 644 mEq/L      Potassium 3.8 mEq/L      Chloride 105 mEq/L      CO2 25 mEq/L      Calcium 10.5 mg/dL      Anion Gap 9.0     GFR >60.0 mL/min/1.73 m2      AST (SGOT) 24 U/L      ALT 29 U/L      Alkaline Phosphatase 45 U/L       Albumin 4.2 g/dL      Protein, Total 7.4 g/dL      Globulin 3.2 g/dL      Albumin/Globulin Ratio 1.3     Bilirubin, Total 0.4 mg/dL     Urinalysis with Reflex to Microscopic Exam and Culture [[id number]]  (  Normal) Collected: 02/22/23 1132    Specimen: Urine, Clean Catch Updated: 02/22/23 1149     Urine Color Yellow     Urine Clarity Clear     Urine Specific Gravity 1.025     Urine pH 6.0     Urine Leukocyte Esterase Negative     Urine Nitrite Negative     Urine Protein Negative     Urine Glucose Negative     Urine Ketones Negative mg/dL      Urine Urobilinogen Normal mg/dL      Urine Bilirubin Negative     Urine Blood Negative    Narrative:      Urine Microscopic not indicated            Radiologic Studies -   Radiology Results (24 Hour)       Procedure Component Value Units Date/Time    CT Abd/Pelvis without Contrast [[id number]] Collected: 02/22/23 1221    Order Status: Completed Updated: 02/22/23 1232    Narrative:      HISTORY:  Left flank pain.    COMPARISON: 06/16/2022    TECHNIQUE: CT of the abdomen and pelvis performed without intravenous  contrast. The following dose reduction techniques were utilized: automated  exposure control and/or adjustment of the mA and/or KV according to patient  size, and the use of an iterative reconstruction technique.    CONTRAST: None.    FINDINGS: Vascular and organ detail is limited without IV contrast.    Linear atelectasis is noted at the left lung base. A small pleural-based  nodule is noted at the right lung base, 8 mm, not significant change from  prior exam. A 4 mm pleural-based nodule is noted at the left lung base  image 1 series 3, unchanged.     Liver cysts are noted. The spleen, adrenals, and pancreas are unremarkable  to the limits of this noncontrast CT. A simple appearing right renal cyst  is noted, benign-appearing. The gallbladder is present and nondistended. A  normal-appearing appendix identified. Colonic diverticulosis is noted  without evidence  diverticulitis. A small fat-containing umbilical hernia is  present. There is no definite acute bowel pathology. No renal or ureteral  calculi are identified. There is no hydronephrosis or perinephric  stranding. Anterolateral osteophytes are noted within the spine.      Impression:        1. Sigmoid colonic diverticulosis without evidence of diverticulitis.  2. No renal or ureteral calculi.    Rocky Crafts, MD  02/22/2023 12:30 PM            Medical Decision Making   I am the first provider for this patient. I reviewed the vital signs, available nursing notes, past medical history, past surgical history, family history and social history.    Old Medical Records: Nursing notes.     Vital Signs-I have reviewed the patient's vital signs.   No data found.      Clinical Decision Support:         Medications Given in the ED:  ED Medication Orders (From admission, onward)      None            Medications Prescribed:  Discharge Prescriptions    None           MDM:     ED Course:   ED Course as of 02/24/23 1257   Mon Feb 22, 2023   1252 advanced orders placed prior to me seeing and evaluating this patient myself, including  CT wo contrast. I would have preferred for study to be done with contrast as pain is actually more over abdomen than flank and symptoms not consistent with kidney stone. Considered repeating scan with contrast, however, do not feel this would be of benefit to patient.  Do not suspect diverticulitis or incarcerated/strangulated hernia based of physical exam and CT findings. the spleen, adrenals, and pancreas are unremarkable. Labs and vitals reassuring. At this time feel that symptoms more consistent with MSK pain given that it is only with movement and not reproducible with palpation. No emergent etiologies identified at this time. Pt will follow up with his GI doctor if symptoms persist.    [EL]   1336 Lipase(!): 86 [EL]   1347 All incidental findings discussed [EL]      ED Course User Index  [EL]  Neil Crouch, PA        72 y.o. male presenting to the ED with left upper quadrant abdominal pain since Thursday, absent at rest and exacerbated with movements.  On exam, pt is comfortable-appearing, HD stable, benign abdominal exam and rest of physical exam as described above.    CBC without leukocytosis, anemia, or thrombocytopenia.  CMP demonstrated calcium of 10.5 but otherwise completely unremarkable and WNL.  Lipase minimally elevated at 86.  UA without evidence of infection or other acute abnormality.  As a result of the above findings, I chose the following management: See ED course.  Pt given clear return precautions including but not limited the development of new or worsening symptoms.  Pt verbalized understanding and agreement with plan and felt comfortable being discharged home at this time.     Diagnosis     Clinical Impression:   1. Left sided abdominal pain of unknown cause        Treatment Plan:   ED Disposition       ED Disposition   Discharge    Condition   --    Date/Time   Mon Feb 22, 2023  1:45 PM    Comment   [name] Young discharge to home/self care.    Condition at disposition: Stable               _____________________________    CHART OWNERSHIPRoena Malady, PA-C, am the primary clinician of record.    This note was generated by the Epic EMR system/ Dragon speech recognition and may contain inherent errors or omissions not intended by the user. Grammatical errors, random word insertions, deletions and pronoun errors  are occasional consequences of this technology due to software limitations. Not all errors are caught or corrected. If there are questions or concerns about the content of this note or information contained within the body of this dictation they should be addressed directly with the author for clarification          [1]   No current facility-administered medications for this encounter.     Current Outpatient Medications   Medication Sig Dispense Refill   .  ibuprofen (ADVIL) 200 MG tablet Take 1 tablet (200 mg) by mouth every 6 (six) hours as needed for Pain     . Acetaminophen (Tylenol) 325 MG Cap Take by mouth as needed     . B Complex Vitamins (VITAMIN B COMPLEX IJ)      . esomeprazole (NexIUM) 20 MG capsule Take 1 capsule (20 mg) by mouth every morning before breakfast     . famotidine (PEPCID) 40 MG tablet      .  Febuxostat (ULORIC) 40 MG tablet Take 1 tablet (40 mg) by mouth every other day     . fenofibrate micronized (LOFIBRA) 134 MG capsule TAKE 1 CAPSULE BY MOUTH EVERY DAY WITH FOOD     . Glucosamine Sulfate 500 MG Cap Take 1 capsule (500 mg) by mouth 2 (two) times daily     . lisinopril (ZESTRIL) 5 MG tablet TAKE 1 TABLET BY MOUTH EVERY DAY   STOP LISINOPRIL 10MG  TABLET.     . Multiple Vitamin (MULTIVITAMIN) tablet Take 1 tablet by mouth daily     . ocrelizumab (Ocrevus) 300 MG/10ML injection Infuse 20 mLs (600 mg) into the vein Twice a year     . Probiotic Product (PHILLIPS COLON HEALTH PO) Take by mouth every evening.     . rosuvastatin (CRESTOR) 10 MG tablet Take 1 tablet (10 mg) by mouth daily     . tadalafil (CIALIS) 5 MG tablet Take 1 tablet (5 mg) by mouth daily     . UNABLE TO FIND Med Name: IBGUARD - over the counter     . vitamin D (CHOLECALCIFEROL) 25 MCG (1000 UT) tablet Take 2 tablets (2,000 Units) by mouth daily     . vitamin D3 (CHOLECALCIFEROL) 125 MCG (5000 UT) capsule Take by mouth daily    (Patient not taking: Reported on 11/11/2022)     [2]   Allergies  Allergen Reactions   . Erythromycin Anaphylaxis and Fever   . Allopurinol Hives     Family hx of problems with allopurinol   . Icosapent Ethyl Diarrhea   . Monosodium Glutamate Headaches   . Pollen Extract       Neil Crouch, [place]  02/24/23 1257       Alycia Rossetti C, DO  02/24/23 1311

## 2023-03-02 ENCOUNTER — Other Ambulatory Visit: Payer: Self-pay

## 2023-03-02 DIAGNOSIS — M545 Low back pain, unspecified: Secondary | ICD-10-CM

## 2023-03-06 ENCOUNTER — Other Ambulatory Visit: Payer: Self-pay

## 2023-03-06 ENCOUNTER — Ambulatory Visit
Admission: RE | Admit: 2023-03-06 | Discharge: 2023-03-06 | Disposition: A | Discharge status: Home / Self Care | Payer: BLUE CROSS/BLUE SHIELD | Source: Ambulatory Visit

## 2023-03-06 DIAGNOSIS — M545 Low back pain, unspecified: Secondary | ICD-10-CM | POA: Insufficient documentation

## 2023-03-06 MED ORDER — GADOBUTROL 1 MMOL/ML IV SOSY (WRAP)
8.0000 mL | Freq: Once | INTRAVENOUS | Status: AC | PRN
Start: 2023-03-06 — End: 2023-03-06
  Administered 2023-03-06: 8 mL via INTRAMUSCULAR
  Filled 2023-03-06: qty 10

## 2023-04-01 ENCOUNTER — Encounter (INDEPENDENT_AMBULATORY_CARE_PROVIDER_SITE_OTHER): Payer: Self-pay | Admitting: Urology

## 2023-04-27 ENCOUNTER — Encounter: Payer: Self-pay | Admitting: Colon & Rectal Surgery

## 2023-05-26 ENCOUNTER — Other Ambulatory Visit: Payer: Self-pay

## 2023-08-26 IMAGING — MR MRI RIGHT SHOULDER WITHOUT CONTRAST
5 of 6 series · 27 of 40 positions shown · IV contrast (gadolinium)
Comparison: None

________________________________________________________________________________________________ 
MRI RIGHT SHOULDER WITHOUT CONTRAST, 08/26/2023 [DATE]: 
CLINICAL INDICATION: Pain In Right Shoulder. Patient fell 3 weeks ago.
TECHNIQUE: Multiplanar, multiecho position MR images of the shoulder were 
performed without intravenous gadolinium enhancement. Patient was scanned on a 
1.5T magnet.

[Series 201: survey right · axial · right · 10.0mm · 0.71mm/px · z∈[-40,+125]mm · 5 of 15 slices shown]
[im 1/15]
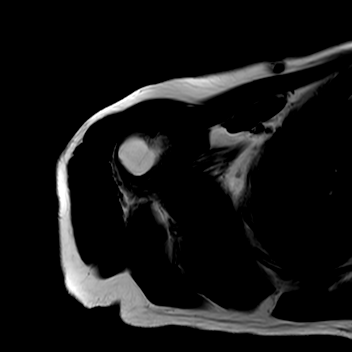
[im 4/15]
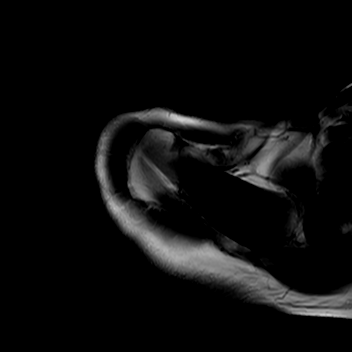
[im 8/15]
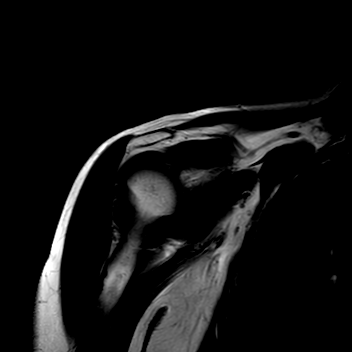
[im 11/15]
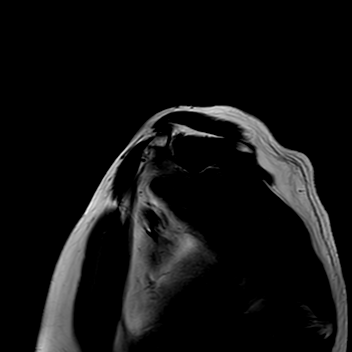
[im 15/15]
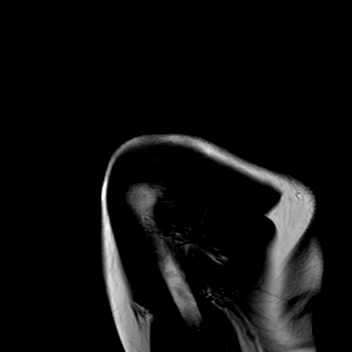

[Series 301: (person_name)_(person_name)_(person_name) right · axial · right · 3.5mm · 0.42mm/px · z∈[-47,+58]mm · 7 of 28 slices shown]
[im 1/28]
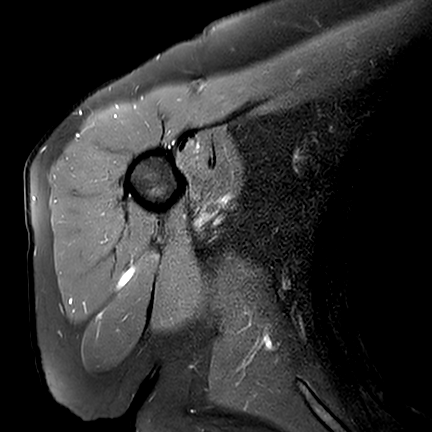
[im 5/28]
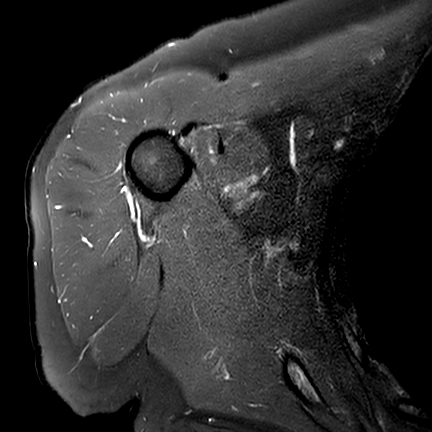
[im 10/28]
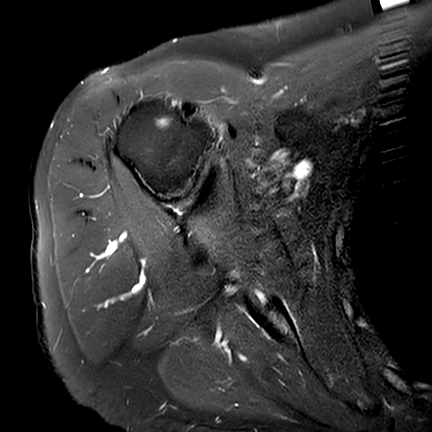
[im 14/28]
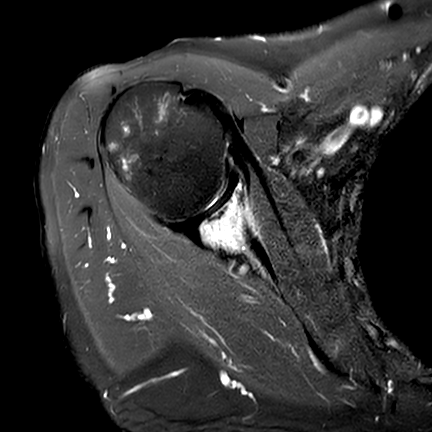
[im 19/28]
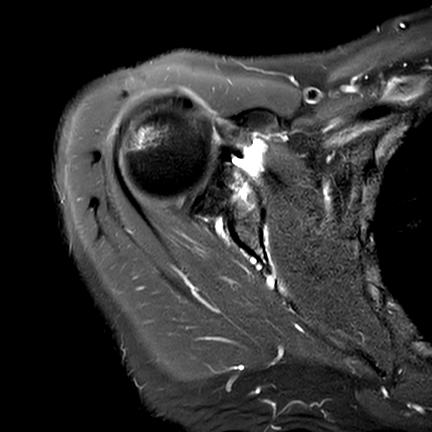
[im 23/28]
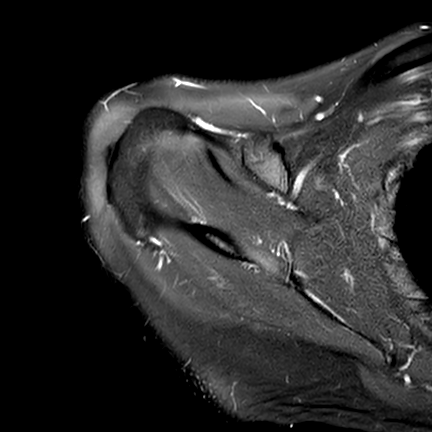
[im 28/28]
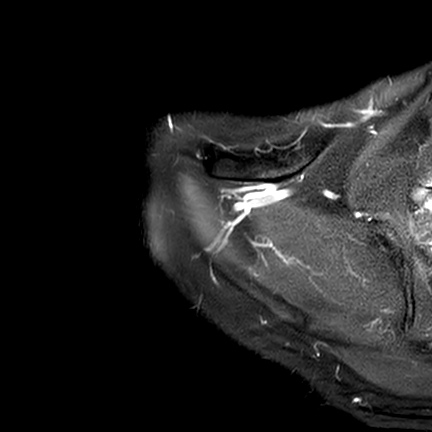

[Series 401: t2_fs_sag right · oblique · right · 3.5mm · 0.37mm/px · 8 of 30 slices shown]
[im 1/30]
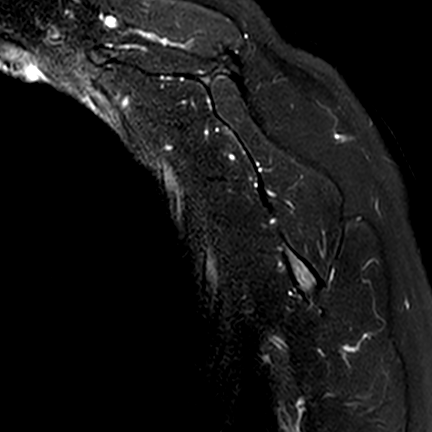
[im 5/30]
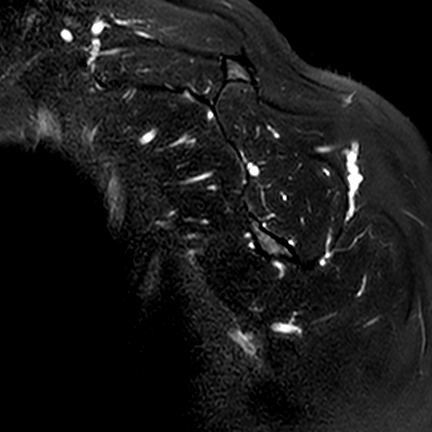
[im 9/30]
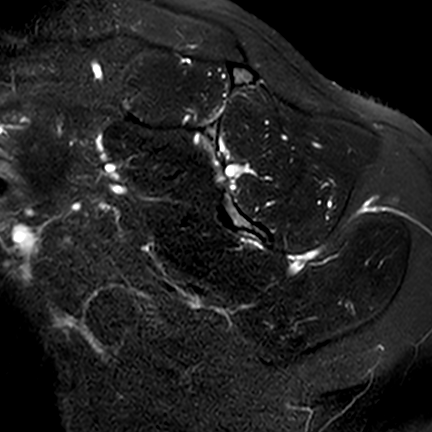
[im 13/30]
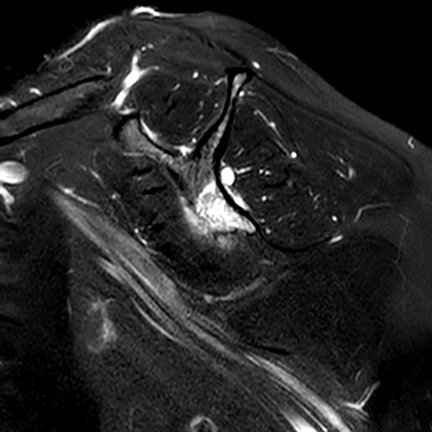
[im 17/30]
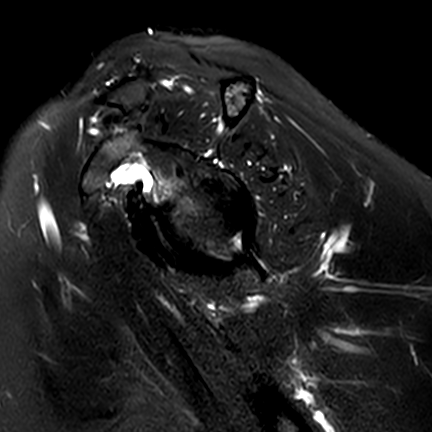
[im 21/30]
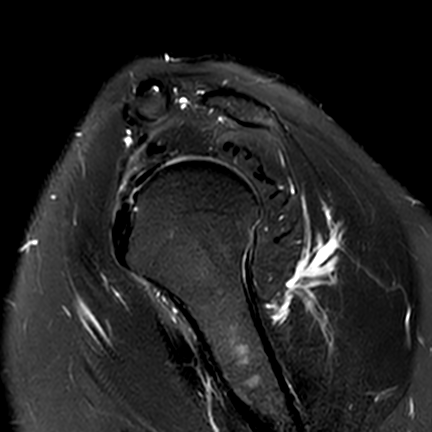
[im 25/30]
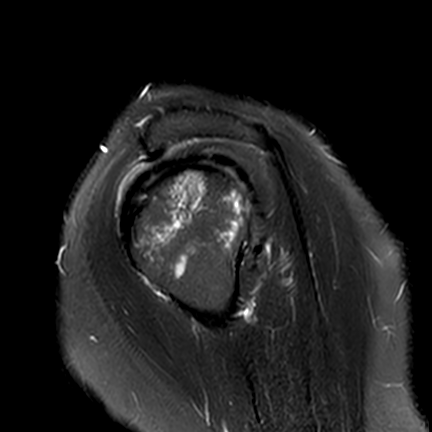
[im 30/30]
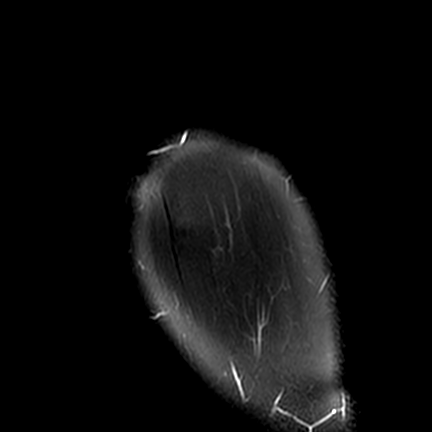

[Series 501: pd_fs_cor right · oblique · right · 3.5mm · 0.40mm/px · 6 of 24 slices shown]
[im 1/24]
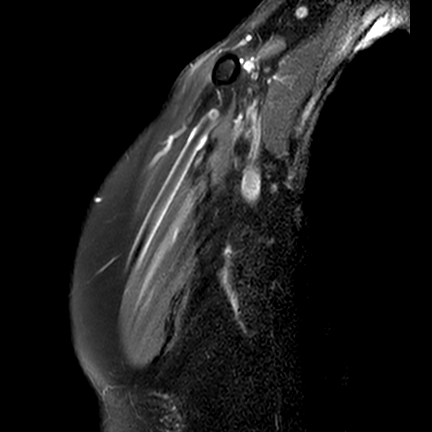
[im 5/24]
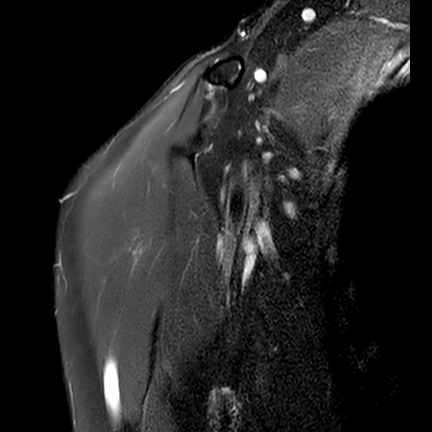
[im 10/24]
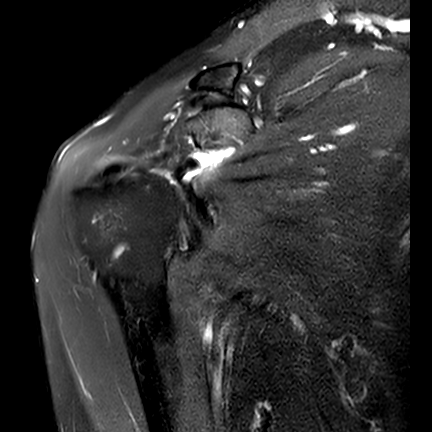
[im 14/24]
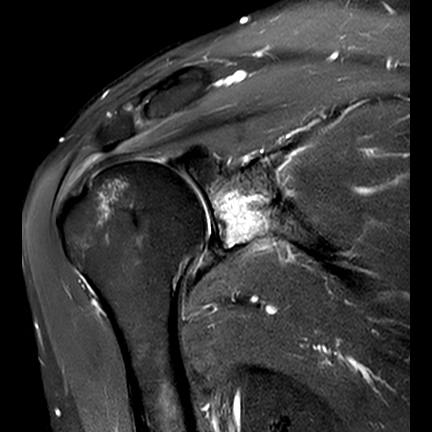
[im 19/24]
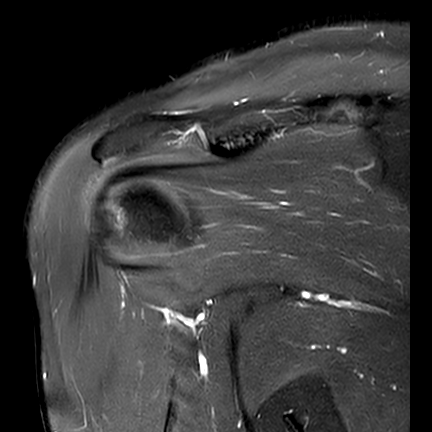
[im 24/24]
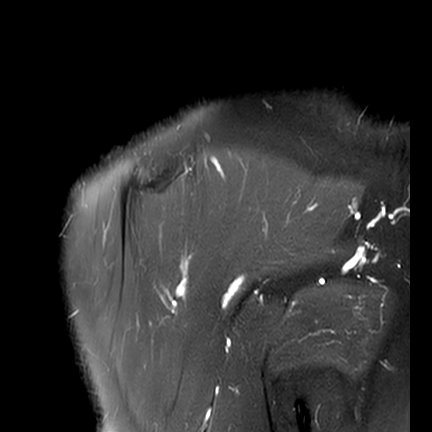

[Series 601: t1_sag right · oblique · right · 3.5mm · 0.31mm/px · 1 of 30 slices shown]
[im 1/30]
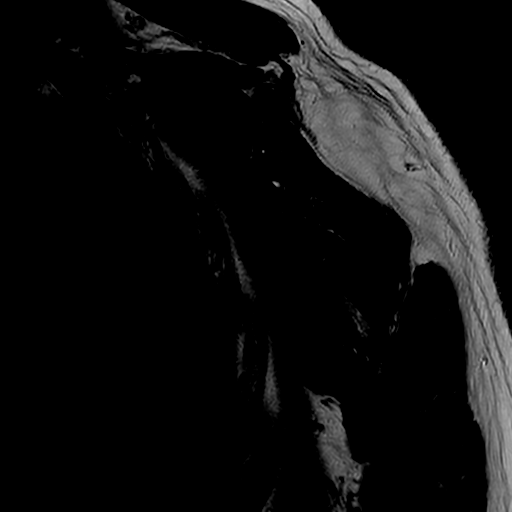

[27 of 40 positions shown; findings below may reference images not displayed]

FINDINGS: ROTATOR CUFF: The supraspinatus, infraspinatus, subscapularis and teres minor 
tendons are intact. The rotator cuff musculature is symmetric without mass, 
signal abnormality or atrophy. 
ACROMIOCLAVICULAR JOINT: Mild degenerative change of the acromioclavicular joint 
without mass effect upon the supraspinatus. The coracoacromial ligament is 
intact without prominent spurring at the acromial attachment. The 
acromioclavicular and coracoclavicular ligaments are preserved. The acromium is 
normal in morphology. 
GLENOHUMERAL JOINT: The humeral head is well located within the glenoid fossa. 
Articular cartilage is preserved.  The glenoid labrum is preserved. No 
paralabral cyst. The intra-articular portion of the long head of the biceps 
tendon is negative. Small joint effusion. 
BONES: Subacute, nondisplaced transverse subchondral fracture of the anterior 
inferior glenoid measures up to 1.7 cm with glenoid bone marrow edema (osseous 
Bankart lesion). Small Hill-Sachs defect with mild bone marrow edema. 
Subcortical cystic change of the humeral head. 1.0 cm humeral neck 
intramedullary cyst. 
ADDITIONAL FINDINGS: The axillary region is negative. Subcutaneous tissues are 
negative.
IMPRESSION: 1.  Subacute, nondisplaced osseous Bankart lesion and small Hill-Sachs defect, 
consistent with prior anterior dislocation. 
2.  Small joint effusion.

## 2023-09-15 ENCOUNTER — Encounter (INDEPENDENT_AMBULATORY_CARE_PROVIDER_SITE_OTHER): Payer: Self-pay | Admitting: Urology

## 2023-12-01 ENCOUNTER — Encounter (INDEPENDENT_AMBULATORY_CARE_PROVIDER_SITE_OTHER): Payer: Self-pay | Admitting: Urology

## 2024-04-26 ENCOUNTER — Other Ambulatory Visit: Payer: Self-pay | Admitting: Neurology

## 2024-05-10 ENCOUNTER — Other Ambulatory Visit: Payer: Self-pay | Admitting: Physician Assistant

## 2024-05-24 ENCOUNTER — Other Ambulatory Visit: Payer: Self-pay | Admitting: Physician Assistant

## 2024-05-24 ENCOUNTER — Ambulatory Visit: Payer: BLUE CROSS/BLUE SHIELD | Attending: Physician Assistant

## 2024-05-24 DIAGNOSIS — M170 Bilateral primary osteoarthritis of knee: Secondary | ICD-10-CM | POA: Insufficient documentation

## 2024-05-31 ENCOUNTER — Encounter (INDEPENDENT_AMBULATORY_CARE_PROVIDER_SITE_OTHER): Payer: Self-pay | Admitting: Urology

## 2024-06-05 ENCOUNTER — Encounter: Payer: Self-pay | Admitting: Colon & Rectal Surgery
